# Patient Record
Sex: Female | Born: 1935
Health system: Southern US, Community
[De-identification: ages and names within clinical notes are randomized; demographics above are authoritative.]

## PROBLEM LIST (undated history)

## (undated) DIAGNOSIS — C801 Malignant (primary) neoplasm, unspecified: Secondary | ICD-10-CM

## (undated) HISTORY — PX: LYMPHADENECTOMY: SHX15

## (undated) HISTORY — PX: TONSILLECTOMY: SUR1361

## (undated) HISTORY — PX: OTHER SURGICAL HISTORY: SHX169

## (undated) HISTORY — PX: BACK SURGERY: SHX140

---

## 1997-09-25 ENCOUNTER — Other Ambulatory Visit: Admission: RE | Admit: 1997-09-25 | Discharge: 1997-09-25 | Payer: Self-pay | Admitting: Hematology and Oncology

## 1998-01-13 ENCOUNTER — Ambulatory Visit (HOSPITAL_COMMUNITY): Admission: RE | Admit: 1998-01-13 | Discharge: 1998-01-13 | Payer: Self-pay | Admitting: Hematology and Oncology

## 1998-07-03 ENCOUNTER — Other Ambulatory Visit: Admission: RE | Admit: 1998-07-03 | Discharge: 1998-07-03 | Payer: Self-pay | Admitting: *Deleted

## 1999-02-09 ENCOUNTER — Other Ambulatory Visit: Admission: RE | Admit: 1999-02-09 | Discharge: 1999-02-09 | Payer: Self-pay | Admitting: *Deleted

## 1999-08-11 ENCOUNTER — Encounter: Payer: Self-pay | Admitting: Hematology and Oncology

## 1999-08-11 ENCOUNTER — Encounter: Admission: RE | Admit: 1999-08-11 | Discharge: 1999-08-11 | Payer: Self-pay | Admitting: Hematology and Oncology

## 2000-07-10 ENCOUNTER — Other Ambulatory Visit: Admission: RE | Admit: 2000-07-10 | Discharge: 2000-07-10 | Payer: Self-pay | Admitting: *Deleted

## 2000-08-15 ENCOUNTER — Encounter: Admission: RE | Admit: 2000-08-15 | Discharge: 2000-08-15 | Payer: Self-pay | Admitting: Hematology and Oncology

## 2000-08-15 ENCOUNTER — Encounter: Payer: Self-pay | Admitting: Hematology and Oncology

## 2000-08-18 ENCOUNTER — Encounter: Payer: Self-pay | Admitting: Hematology and Oncology

## 2000-08-18 ENCOUNTER — Encounter: Admission: RE | Admit: 2000-08-18 | Discharge: 2000-08-18 | Payer: Self-pay | Admitting: Hematology and Oncology

## 2000-11-29 ENCOUNTER — Other Ambulatory Visit: Admission: RE | Admit: 2000-11-29 | Discharge: 2000-11-29 | Payer: Self-pay | Admitting: Gynecology

## 2001-07-16 ENCOUNTER — Other Ambulatory Visit: Admission: RE | Admit: 2001-07-16 | Discharge: 2001-07-16 | Payer: Self-pay | Admitting: *Deleted

## 2001-08-20 ENCOUNTER — Encounter: Payer: Self-pay | Admitting: Hematology and Oncology

## 2001-08-20 ENCOUNTER — Encounter: Admission: RE | Admit: 2001-08-20 | Discharge: 2001-08-20 | Payer: Self-pay | Admitting: Hematology and Oncology

## 2001-10-03 ENCOUNTER — Encounter: Payer: Self-pay | Admitting: Neurosurgery

## 2001-10-05 ENCOUNTER — Inpatient Hospital Stay (HOSPITAL_COMMUNITY): Admission: RE | Admit: 2001-10-05 | Discharge: 2001-10-08 | Payer: Self-pay | Admitting: Neurosurgery

## 2001-10-05 ENCOUNTER — Encounter: Payer: Self-pay | Admitting: Neurosurgery

## 2001-11-05 ENCOUNTER — Encounter: Admission: RE | Admit: 2001-11-05 | Discharge: 2001-12-14 | Payer: Self-pay | Admitting: Neurosurgery

## 2002-08-22 ENCOUNTER — Encounter: Admission: RE | Admit: 2002-08-22 | Discharge: 2002-08-22 | Payer: Self-pay | Admitting: Oncology

## 2002-08-22 ENCOUNTER — Encounter: Payer: Self-pay | Admitting: Oncology

## 2003-08-25 ENCOUNTER — Encounter: Admission: RE | Admit: 2003-08-25 | Discharge: 2003-08-25 | Payer: Self-pay | Admitting: Oncology

## 2004-08-26 ENCOUNTER — Encounter: Admission: RE | Admit: 2004-08-26 | Discharge: 2004-08-26 | Payer: Self-pay | Admitting: Oncology

## 2004-12-01 ENCOUNTER — Ambulatory Visit: Payer: Self-pay | Admitting: Oncology

## 2005-06-02 ENCOUNTER — Ambulatory Visit: Payer: Self-pay | Admitting: Oncology

## 2005-08-30 ENCOUNTER — Encounter: Admission: RE | Admit: 2005-08-30 | Discharge: 2005-08-30 | Payer: Self-pay | Admitting: Oncology

## 2005-11-25 ENCOUNTER — Ambulatory Visit: Payer: Self-pay | Admitting: Oncology

## 2006-05-19 ENCOUNTER — Ambulatory Visit: Payer: Self-pay | Admitting: Oncology

## 2006-09-04 ENCOUNTER — Encounter: Admission: RE | Admit: 2006-09-04 | Discharge: 2006-09-04 | Payer: Self-pay | Admitting: Oncology

## 2007-03-16 ENCOUNTER — Ambulatory Visit: Payer: Self-pay | Admitting: Oncology

## 2007-09-05 ENCOUNTER — Encounter: Admission: RE | Admit: 2007-09-05 | Discharge: 2007-09-05 | Payer: Self-pay | Admitting: Oncology

## 2007-09-13 ENCOUNTER — Ambulatory Visit: Payer: Self-pay | Admitting: Oncology

## 2007-12-27 ENCOUNTER — Encounter (INDEPENDENT_AMBULATORY_CARE_PROVIDER_SITE_OTHER): Payer: Self-pay | Admitting: Orthopedic Surgery

## 2007-12-27 ENCOUNTER — Inpatient Hospital Stay (HOSPITAL_COMMUNITY): Admission: EM | Admit: 2007-12-27 | Discharge: 2007-12-31 | Payer: Self-pay | Admitting: Emergency Medicine

## 2008-04-14 ENCOUNTER — Encounter: Admission: RE | Admit: 2008-04-14 | Discharge: 2008-05-12 | Payer: Self-pay | Admitting: Internal Medicine

## 2008-09-11 ENCOUNTER — Encounter: Admission: RE | Admit: 2008-09-11 | Discharge: 2008-09-11 | Payer: Self-pay | Admitting: Family Medicine

## 2009-09-16 ENCOUNTER — Encounter: Admission: RE | Admit: 2009-09-16 | Discharge: 2009-09-16 | Payer: Self-pay | Admitting: Family Medicine

## 2010-10-19 NOTE — H&P (Signed)
Sierra Allen, Sierra Allen          ACCOUNT NO.:  [id number]   MEDICAL RECORD NO.:  [id number]          PATIENT TYPE:  INP   LOCATION:  0098                         FACILITY:  [facility]   PHYSICIAN:  Eulas Post, MD    DATE OF BIRTH:  1926/11/29   DATE OF ADMISSION:  12/27/2007  DATE OF DISCHARGE:                              HISTORY & PHYSICAL   CHIEF COMPLAINT:  Left hip pain.   HISTORY OF PRESENT ILLNESS:  Patient is a 77 year old white female who  fell today at the respite at [facility].  She was playing balloon  volleyball.  She reached out to hit a balloon, fell, landed on her  twisted left leg.  She had an immediate sharp 10/10 pain in her left hip  with obvious deformity.  She was transported by EMS to [facility].   PAST MEDICAL HISTORY:  Significant for breast cancer in 1998, which she  had chemo for.  No history of diabetes or heart disease.   FAMILY HISTORY:  Mom is deceased with a stroke.  Dad is deceased with  cancer.   SOCIAL HISTORY:  Patient is widowed.  She lives alone.  No tobacco, no  alcohol, no drugs.   ALLERGIES:  PENICILLIN.   CURRENT MEDICATIONS:  None.   REVIEW OF SYSTEMS:  Positive for left hip pain.  Easy bruising.  Hard of  hearing.  Negative for recent weight loss, visual changes.  No chest  pain.  No shortness of breath, no nausea, vomiting, diarrhea, or  constipation.  No urinary frequency or pain.  No numbness, no tingling.  No psychiatric problems.  No diabetes.  No thyroid.  No cough, cold, or  recent infections.   PHYSICAL EXAMINATION:  Blood pressure is 158/83, respirations 18, pulse  94, temperature 98.  She is a well-developed and well-nourished 77 year old white female in  mild distress.  She is normocephalic and atraumatic.  Extraocular movements are intact.  Pupils are equal, round and reactive to light and accommodation.  Glasses are in place.  NECK:  Supple without adenopathy or JVD.  CHEST:  Clear.  No wheezes,  rales or rhonchi.  Both breasts have healed scars from lumpectomy with a history of a  breast cancer in her right breast.  HEART:  Normal S1 and S2 with a regular rate and rhythm without murmur,  rub or gallop.  ABDOMEN:  Soft and nontender with positive bowel sounds.  No  organomegaly.  PELVIC:  External:  She has left hip pain.  No right-sided external  pain.  Internal exam was not performed.  GU:  She had a Foley in place with good urine production.  EXTREMITIES:  Right lower extremity and both upper extremities have full  range of motion without pain, swelling, or deformity.  Left hip is  shortened, internally rotated with significant pain.  She has a 2+  dorsalis pedis pulse, multiple spider veins, and tenderness about her  left hip.  SKIN:  Warm and dry without rashes or abrasions.  She has multiple areas  of ecchymosis on both upper extremities, she states from her easy  bruising.  She also had marked spider veins on her lower extremities.  NEUROLOGIC:  She is alert and oriented x3.  Cranial nerves II-XII are  grossly intact.  She has no numbness or tingling anywhere.   X-rays show left hip subtrochanteric femur fracture.   Labs show elevated white count of 11.9 with a shift secondary to trauma.  Hemoglobin is 12.7, hematocrit 37.8.  Platelets are borderline normal at  154.  Sodium is 138, potassium 3.7, chloride 105, CO2 26, BUN 14,  creatinine 0.7, and glucose is 100.  Her INR is within normal limits at  1.  Her UA shows some elevated ketones and no other abnormalities with a  negative leukocyte esterase and no nitrites.  No bacteria seen.   IMPRESSION:  1. Left hip subtrochanteric fracture secondary to trauma on December 27, 2007.  2. History of breast cancer in 1998.   PLAN:  At this point in time she is being admitted for IM nailing of her  left hip.  Risks, benefits and possible complications of this surgery as  well as postoperative course is discussed with the  patient in detail.  She does understand that it will be medically necessary for her to go to  a skilled nursing facility following surgery, following a short  hospitalization, for complete recovery.  I have discussed this with her.  [facility] choice [facility].  A social work consult was ordered for  this.  Risks, benefits and possible complications of the procedure have  been discussed in detail with the patient, and she is without question.      Kirstin Shepperson, P.A.      Eulas Post, MD  Electronically Signed    KS/MEDQ  D:  12/27/2007  T:  12/27/2007  Job:  [id number]

## 2010-10-19 NOTE — Op Note (Signed)
Allen, Sierra          ACCOUNT NO.:  [id number]   MEDICAL RECORD NO.:  [id number]          PATIENT TYPE:  INP   LOCATION:  0098                         FACILITY:  [facility]   PHYSICIAN:  Eulas Post, MD    DATE OF BIRTH:  27-Jan-1927   DATE OF PROCEDURE:  12/27/2007  DATE OF DISCHARGE:                               OPERATIVE REPORT   PREOPERATIVE DIAGNOSIS:  Left reverse obliquity subtrochanteric femoral  fracture.   POSTOPERATIVE DIAGNOSIS:  Left reverse obliquity subtrochanteric femoral  fracture.   OPERATIVE PROCEDURE:  Closed reduction with long trochanteric femoral  nailing of the left reverse obliquity subtrochanteric fracture.   ANESTHESIA:  General.   ESTIMATED BLOOD LOSS:  100 mL.   OPERATIVE IMPLANT:  Synthes size 11 left long trochanteric femoral nail  with a length of 360 mm and a single helical blade size 90 mm.   PREOPERATIVE INDICATIONS:  Ms. Sierra Allen is a 77 year old woman  who was reportedly playing volleyball when she fell and broke her left  hip.  She was brought to the emergency room and admitted and optimized  preoperatively and underwent closed reduction with intramedullary  nailing.  Risks, benefits and alternatives were discussed with her  preoperatively including but not limited to the risks of infection,  bleeding, nerve injury, malunion, nonunion, leg-length discrepancy,  rotational deformity, hardware failure, hardware prominence, need for  hardware removal, the need for revision surgery, cardiopulmonary  complications, among others, and she is willing to proceed.   SPECIMENS:  Reamings from the proximal femur were sent to pathology  given her history of breast cancer.   OPERATIVE PROCEDURE:  The patient was brought to the operating and  placed in supine position.  General anesthesia was administered.  Left  lower extremity was prepped and draped in the usual sterile fashion.  Incision was made over the proximal trochanter.   Guidewire was placed.  Satisfactory position was achieved in the proximal segment.  Reduction  was performed.  We opened the proximal segment and then placed the long  guidewire down the femur.  Appropriate length was selected.  We then  [facility] our nail into place.  We actually placed the nail by hand and  did not require a mallet.  We then placed our helical blade using the  appropriate jig.  Center/center position was achieved, and excellent tip  apex defects distance was achieved.  We then tested leg by reducing the  traction and taking the leg under live fluoroscopy with rotation as well  as direct impaction, and it was found have an excellent stability.  The  rotational stability and length was appropriate.  Therefore, we decided  not to interlock the nail distally, as this would also increase the risk  of periprosthetic fracture through a stress riser distally.  The wounds  were then irrigated copiously and deep tissue closed with 0 Vicryl  followed by 3-0 followed by Monocryl for the skin.  Steri-Strips were  applied followed by gauze.  The patient was awakened and returned to  PACU in stable and satisfactory condition.  There were no complications.  The patient tolerated the  procedure well.      Eulas Post, MD  Electronically Signed     JPL/MEDQ  D:  12/27/2007  T:  12/27/2007  Job:  [id number]

## 2010-10-19 NOTE — Discharge Summary (Signed)
Sierra Allen, Sierra Allen          ACCOUNT NO.:  [id number]   MEDICAL RECORD NO.:  [id number]          PATIENT TYPE:  INP   LOCATION:  1604                         FACILITY:  [facility]   PHYSICIAN:  Eulas Post, MD    DATE OF BIRTH:  07-17-1926   DATE OF ADMISSION:  12/27/2007  DATE OF DISCHARGE:  12/31/2007                               DISCHARGE SUMMARY   TRANSFER SUMMARY:   ADMISSION DIAGNOSIS:  Left intertrochanteric hip fracture  (subtrochanteric reverse obliquity).   DISCHARGE DIAGNOSIS:  Left intertrochanteric hip fracture  (subtrochanteric reverse obliquity).   ADDITIONAL DIAGNOSIS:  Acute postoperative blood loss anemia.   PROCEDURE:  Left femur trochanteric femoral nailing performed December 27, 2007.   DISCHARGE MEDICATIONS:  1. Coumadin 2.5 mg p.o. daily.  2. Vicodin 1-2 tablets p.o. q.12h. p.r.n. pain.  3. Colace 100 mg p.o. b.i.d.   HOSPITAL COURSE:  Mrs. Sierra Allen is a 77 year old woman who  fell while playing volleyball and broke her left hip.  She was found to  have a subtrochanteric femur fracture.  She underwent intramedullary  nailing and tolerated the procedure well.  There were no complications.  She began working with physical therapy on postoperative day #1 and was  making slow progress.  Her wounds were clean, dry, and intact.  She has  been on Coumadin for DVT prophylaxis.  She was given sequential  compression devices also.  She was given Ancef for antimicrobial  prophylaxis.  Her hemoglobin and hematocrit dropped to a low of 8 and  26; however, she remained relatively asymptomatic.  Her wounds were dry  and healing well.   DISPOSITION:  She can be weightbearing as tolerated and is going to be  transferred to a skilled nursing facility for ongoing therapy.  She will  be on Coumadin for a period of about 1 month until the date of her  surgery.  The patient benefited maximally from her hospital stay, and  there were no complications.  She is  planned to follow up with me in  approximately 2 weeks.      Eulas Post, MD  Electronically Signed     JPL/MEDQ  D:  12/31/2007  T:  12/31/2007  Job:  [id number]

## 2010-10-22 NOTE — Discharge Summary (Signed)
[place]. [facility]  Patient:    GREGORY, DOWE Visit Number: [id number] MRN: [id number]          Service Type: SUR Location: 3000 3012 [id number] Attending Physician:  Danella Penton Dictated by:   Tanya Nones. Jeral Fruit, M.D. Admit Date:  10/05/2001 Discharge Date: 10/08/2001                             Discharge Summary  ADMISSION DIAGNOSIS:  Lumbar stenosis with a neurogenic claudication.  DISCHARGE DIAGNOSIS:  Lumbar stenosis with a neurogenic claudication.  HISTORY OF PRESENT ILLNESS:  The patient was admitted because of neurogenic claudication.  X-ray showed diffuse lumbar stenosis from L2-3 down to L5-S1. Surgery was advised.  LABORATORY DATA:  Normal.  HOSPITAL COURSE:  The patient was taken to surgery and a bilateral 3-4-5 laminectomy and partial 3 was done.  We found a loose facet and because of that we used her own bone and proceeded with a posterolateral fusion.  Today, the patient is doing great.  She is ambulating.  She feels better.  Wounds look fine, she is afebrile, and she is ready to go home.  CONDITION ON DISCHARGE:  Improved.  DISCHARGE MEDICATIONS:  Percocet and Flexoril.  DIET:  Regular.  ACTIVITY:  She is not to drive for at least two weeks and she is to do no lifting more than 5 pounds.  FOLLOW-UP:  I will see her in four weeks in my office.  Also, we are going to arrange for her to have home health physical therapy at home.  Any questions, she is going to call me anytime. Dictated by:   Tanya Nones. Jeral Fruit, M.D. Attending Physician:  Danella Penton DD:  10/08/01 TD:  10/10/01 Job: [id number]

## 2010-10-22 NOTE — H&P (Signed)
[place]. [facility]  Patient:    Sierra Allen, Sierra Allen Visit Number: [id number] MRN: [id number]          Service Type: SUR Location: 3000 3012 [id number] Attending Physician:  Danella Penton Dictated by:   Tanya Nones. Jeral Fruit, M.D. Admit Date:  10/05/2001                           History and Physical  HISTORY OF PRESENT ILLNESS:  Sierra Allen is a lady who had been complaining for many years of back pain with radiation down to both legs and weakness. Right now, she is complaining of numbness in both legs, weakness of both feet, right worse than the left one, up to the point that she cannot lift the right foot and claudication when she walks.  She tells me that when she walks a block or less, she has to sit because she develops cramping and numbness in both legs.  She had a peripheral vascular workup which was negative.  Because of the x-rays, she is being admitted today for surgery.  PAST MEDICAL HISTORY:  Cancer of the breast in 1998.  ALLERGIES:  She is allergic to PENICILLIN.  Also, she tells me that she is allergic to [facility].  SOCIAL HISTORY:  Negative.  FAMILY HISTORY:  Mother died when she was 72 of a stroke.  REVIEW OF SYSTEMS:  Positive for hearing loss on the left side and also in the right side.  She has weakness of both legs and back pain.  Also, she tells me that she is allergic to shellfish.  PHYSICAL EXAMINATION:  GENERAL:  The patient, when she came to see me, had difficulty walking on the right leg.  HEENT:  Normal.  NECK:  Normal.  LUNGS:  Clear.  HEART:  Heart sounds normal.  ABDOMEN:  Normal.  EXTREMITIES:  Normal pulses.  NEUROLOGIC:  Mental status normal.  Cranial nerves are normal except for hearing loss bilaterally.  Motor examination showed that she has weakness on dorsiflexion of both feet, the right worse than the left one -- it was 2/5 -- and plantar flexion of the feet was 3/5.  There is no atrophy.   Sensation normal.  Deep tendon reflexes 1+.  LABORATORY AND ACCESSORY DATA:  The MRI showed that she has a severe case of spondylosis at the levels of 3-4, 4-5 and borderline between 2 and 3.  CLINICAL IMPRESSION:  Neurogenic claudication secondary to lumbar stenosis.  RECOMMENDATIONS:  The patient is being admitted for bilateral 3, 4, 5 and partial 2 laminectomies with foraminotomies.  The risks of course are infection, CSF leak, worsening of the pain, paralysis, need for further surgery, damage to the vessels of the abdomen and no improvement whatsoever. Dictated by:   Tanya Nones. Jeral Fruit, M.D. Attending Physician:  Danella Penton DD:  10/05/01 TD:  10/06/01 Job: [id number]

## 2010-10-22 NOTE — Op Note (Signed)
[place]. [facility]  Patient:    Sierra Allen, Sierra Allen Visit Number: [id number] MRN: [id number]          Service Type: SUR Location: 3000 3012 [id number] Attending Physician:  Danella Penton Dictated by:   Tanya Nones. Jeral Fruit, M.D. Proc. Date: 10/05/01 Admit Date:  10/05/2001                             Operative Report  PREOPERATIVE DIAGNOSIS:  Lumbar stenosis with neurogenic claudication.  POSTOPERATIVE DIAGNOSIS:  Lumbar stenosis with neurogenic claudication.  PROCEDURES:  L3, L4, L5 laminectomy, foraminotomy, partial laminotomy of L2, posterolateral fusion L3 to S1 bilaterally.  SURGEON:  Tanya Nones. Jeral Fruit, M.D.  ASSISTANT:  Payton Doughty, M.D.  CLINICAL HISTORY:  The patient was admitted because of claudication.  The patient has also weakness of both feet.  She cannot walk more than one block, develops cramping and numbness in both legs, and she has to stop.  X-rays show severe stenosis between 3-4, 4-5, 5-1, _____ at the level of 2-3.  Surgery was advised.  DESCRIPTION OF PROCEDURE:  The patient was taken to the operating room, and she was positioned in a prone manner.  The back was prepped with Betadine.  A midline incision from L2 to S1 was made.  Muscle was retracted laterally. With the [place], we removed the spinous process of 5, 4, 3 and partial of L2.  With the drill we started drilling the laminae starting at the level of 5, going all the way up to 3.  Indeed, there was quite a bit of stenosis up to the point that the dural sac was probably about half an inch from the facet joint down.  Having good decompression, we went laterally and we did a foraminotomy to decompress the S1, L4, L4, and L3 nerve roots.  We undermined the lamina of L2, we removed the yellow ligament, and we were able to decompress L2 bilaterally.  We found that the left 4 facet was loose.  Because of that we decided to remove it.  We went laterally, and we identified  the spinous process of 3, 4, 5, and the ala of the sacrum.  Using autograft plus Vitoss, we mixed all that with blood and we went laterally.  We drilled the spinous process, and we packed the gutter with this mixture of bone and Vitoss.  X-ray showed that indeed we were at the level of L2 through S1. Having good hemostasis, the area was irrigated.  The wound was closed with Vicryl and a Steri-Strip.  The patient did well, and she is going to go t the recovery room. Dictated by:   Tanya Nones. Jeral Fruit, M.D. Attending Physician:  Danella Penton DD:  10/05/01 TD:  10/08/01 Job: [id number]

## 2011-03-04 LAB — BASIC METABOLIC PANEL
BUN: 13
BUN: 6
CO2: 22
CO2: 24
Chloride: 110
Chloride: 112
GFR calc Af Amer: >60
GFR calc Af Amer: >60
GFR calc non Af Amer: >60
GFR calc non Af Amer: >60
Glucose, Bld: 105 — ABNORMAL HIGH
Glucose, Bld: 116 — ABNORMAL HIGH
Glucose, Bld: 127 — ABNORMAL HIGH
Potassium: 3.6
Potassium: 3.9
Potassium: 4.2
Potassium: 4.5
Sodium: 138
Sodium: 138
Sodium: 140
Sodium: 140

## 2011-03-04 LAB — URINALYSIS, ROUTINE W REFLEX MICROSCOPIC
Glucose, UA: NEGATIVE
Hgb urine dipstick: NEGATIVE
Protein, ur: NEGATIVE
Specific Gravity, Urine: 1.013
pH: 7.5

## 2011-03-04 LAB — CBC
HCT: 26 — ABNORMAL LOW
HCT: 31.5 — ABNORMAL LOW
HCT: 37.8
Hemoglobin: 10.6 — ABNORMAL LOW
Hemoglobin: 12.7
Hemoglobin: 8.8 — ABNORMAL LOW
Hemoglobin: 9.3 — ABNORMAL LOW
MCHC: 34.1
MCV: 96
MCV: 96
MCV: 96.1
MCV: 96.9
Platelets: 103 — ABNORMAL LOW
Platelets: 105 — ABNORMAL LOW
Platelets: 129 — ABNORMAL LOW
RBC: 2.84 — ABNORMAL LOW
RBC: 3.22 — ABNORMAL LOW
RBC: 3.25 — ABNORMAL LOW
RBC: 3.93
RDW: 13.8
WBC: 11.9 — ABNORMAL HIGH
WBC: 12.2 — ABNORMAL HIGH
WBC: 6.2
WBC: 8.5

## 2011-03-04 LAB — PROTIME-INR
INR: 1
INR: 1.7 — ABNORMAL HIGH
INR: 1.8 — ABNORMAL HIGH
Prothrombin Time: 13.8
Prothrombin Time: 20.9 — ABNORMAL HIGH
Prothrombin Time: 22 — ABNORMAL HIGH

## 2011-03-04 LAB — CROSSMATCH
ABO/RH(D): O POS
Antibody Screen: NEGATIVE

## 2011-03-04 LAB — POCT I-STAT, CHEM 8
Calcium, Ion: 1.13
Chloride: 105
Glucose, Bld: 100 — ABNORMAL HIGH
HCT: 38
Hemoglobin: 12.9
TCO2: 26

## 2011-03-04 LAB — DIFFERENTIAL
Basophils Absolute: 0
Basophils Relative: 0
Eosinophils Absolute: 0
Eosinophils Relative: 0
Monocytes Absolute: 0.6
Monocytes Relative: 5
Neutro Abs: 10.7 — ABNORMAL HIGH

## 2014-09-08 DIAGNOSIS — H35371 Puckering of macula, right eye: Secondary | ICD-10-CM | POA: Diagnosis not present

## 2014-09-08 DIAGNOSIS — H40012 Open angle with borderline findings, low risk, left eye: Secondary | ICD-10-CM | POA: Diagnosis not present

## 2014-09-08 DIAGNOSIS — H26491 Other secondary cataract, right eye: Secondary | ICD-10-CM | POA: Diagnosis not present

## 2014-09-08 DIAGNOSIS — H3531 Nonexudative age-related macular degeneration: Secondary | ICD-10-CM | POA: Diagnosis not present

## 2014-09-30 DIAGNOSIS — H3532 Exudative age-related macular degeneration: Secondary | ICD-10-CM | POA: Diagnosis not present

## 2014-09-30 DIAGNOSIS — H3531 Nonexudative age-related macular degeneration: Secondary | ICD-10-CM | POA: Diagnosis not present

## 2014-09-30 DIAGNOSIS — H35051 Retinal neovascularization, unspecified, right eye: Secondary | ICD-10-CM | POA: Diagnosis not present

## 2014-10-02 DIAGNOSIS — H3532 Exudative age-related macular degeneration: Secondary | ICD-10-CM | POA: Diagnosis not present

## 2014-11-06 DIAGNOSIS — H3532 Exudative age-related macular degeneration: Secondary | ICD-10-CM | POA: Diagnosis not present

## 2014-12-15 DIAGNOSIS — H3532 Exudative age-related macular degeneration: Secondary | ICD-10-CM | POA: Diagnosis not present

## 2014-12-18 DIAGNOSIS — H40012 Open angle with borderline findings, low risk, left eye: Secondary | ICD-10-CM | POA: Diagnosis not present

## 2015-02-02 DIAGNOSIS — H3532 Exudative age-related macular degeneration: Secondary | ICD-10-CM | POA: Diagnosis not present

## 2015-03-04 DIAGNOSIS — Z23 Encounter for immunization: Secondary | ICD-10-CM | POA: Diagnosis not present

## 2015-03-30 DIAGNOSIS — H353211 Exudative age-related macular degeneration, right eye, with active choroidal neovascularization: Secondary | ICD-10-CM | POA: Diagnosis not present

## 2015-04-13 DIAGNOSIS — H35371 Puckering of macula, right eye: Secondary | ICD-10-CM | POA: Diagnosis not present

## 2015-04-13 DIAGNOSIS — Z961 Presence of intraocular lens: Secondary | ICD-10-CM | POA: Diagnosis not present

## 2015-04-13 DIAGNOSIS — H40012 Open angle with borderline findings, low risk, left eye: Secondary | ICD-10-CM | POA: Diagnosis not present

## 2015-04-13 DIAGNOSIS — H43813 Vitreous degeneration, bilateral: Secondary | ICD-10-CM | POA: Diagnosis not present

## 2015-06-10 DIAGNOSIS — H35363 Drusen (degenerative) of macula, bilateral: Secondary | ICD-10-CM | POA: Diagnosis not present

## 2015-06-10 DIAGNOSIS — H353211 Exudative age-related macular degeneration, right eye, with active choroidal neovascularization: Secondary | ICD-10-CM | POA: Diagnosis not present

## 2015-06-10 DIAGNOSIS — H26492 Other secondary cataract, left eye: Secondary | ICD-10-CM | POA: Diagnosis not present

## 2015-09-08 DIAGNOSIS — H353211 Exudative age-related macular degeneration, right eye, with active choroidal neovascularization: Secondary | ICD-10-CM | POA: Diagnosis not present

## 2015-09-08 DIAGNOSIS — H353133 Nonexudative age-related macular degeneration, bilateral, advanced atrophic without subfoveal involvement: Secondary | ICD-10-CM | POA: Diagnosis not present

## 2015-09-24 DIAGNOSIS — Z08 Encounter for follow-up examination after completed treatment for malignant neoplasm: Secondary | ICD-10-CM | POA: Diagnosis not present

## 2015-09-24 DIAGNOSIS — X32XXXA Exposure to sunlight, initial encounter: Secondary | ICD-10-CM | POA: Diagnosis not present

## 2015-09-24 DIAGNOSIS — L570 Actinic keratosis: Secondary | ICD-10-CM | POA: Diagnosis not present

## 2015-09-24 DIAGNOSIS — C44622 Squamous cell carcinoma of skin of right upper limb, including shoulder: Secondary | ICD-10-CM | POA: Diagnosis not present

## 2015-09-24 DIAGNOSIS — Z85828 Personal history of other malignant neoplasm of skin: Secondary | ICD-10-CM | POA: Diagnosis not present

## 2015-10-20 DIAGNOSIS — H40012 Open angle with borderline findings, low risk, left eye: Secondary | ICD-10-CM | POA: Diagnosis not present

## 2015-10-20 DIAGNOSIS — H2589 Other age-related cataract: Secondary | ICD-10-CM | POA: Diagnosis not present

## 2015-11-11 DIAGNOSIS — Z85828 Personal history of other malignant neoplasm of skin: Secondary | ICD-10-CM | POA: Diagnosis not present

## 2015-11-11 DIAGNOSIS — L723 Sebaceous cyst: Secondary | ICD-10-CM | POA: Diagnosis not present

## 2015-11-11 DIAGNOSIS — Z08 Encounter for follow-up examination after completed treatment for malignant neoplasm: Secondary | ICD-10-CM | POA: Diagnosis not present

## 2015-11-18 DIAGNOSIS — L905 Scar conditions and fibrosis of skin: Secondary | ICD-10-CM | POA: Diagnosis not present

## 2015-11-18 DIAGNOSIS — C44622 Squamous cell carcinoma of skin of right upper limb, including shoulder: Secondary | ICD-10-CM | POA: Diagnosis not present

## 2015-11-18 DIAGNOSIS — Z08 Encounter for follow-up examination after completed treatment for malignant neoplasm: Secondary | ICD-10-CM | POA: Diagnosis not present

## 2015-11-18 DIAGNOSIS — Z85828 Personal history of other malignant neoplasm of skin: Secondary | ICD-10-CM | POA: Diagnosis not present

## 2016-01-05 DIAGNOSIS — H26492 Other secondary cataract, left eye: Secondary | ICD-10-CM | POA: Diagnosis not present

## 2016-01-05 DIAGNOSIS — H35363 Drusen (degenerative) of macula, bilateral: Secondary | ICD-10-CM | POA: Diagnosis not present

## 2016-01-05 DIAGNOSIS — H353133 Nonexudative age-related macular degeneration, bilateral, advanced atrophic without subfoveal involvement: Secondary | ICD-10-CM | POA: Diagnosis not present

## 2016-01-05 DIAGNOSIS — H353212 Exudative age-related macular degeneration, right eye, with inactive choroidal neovascularization: Secondary | ICD-10-CM | POA: Diagnosis not present

## 2016-02-16 DIAGNOSIS — H26492 Other secondary cataract, left eye: Secondary | ICD-10-CM | POA: Diagnosis not present

## 2016-02-16 DIAGNOSIS — H353133 Nonexudative age-related macular degeneration, bilateral, advanced atrophic without subfoveal involvement: Secondary | ICD-10-CM | POA: Diagnosis not present

## 2016-02-16 DIAGNOSIS — H35363 Drusen (degenerative) of macula, bilateral: Secondary | ICD-10-CM | POA: Diagnosis not present

## 2016-02-16 DIAGNOSIS — H353212 Exudative age-related macular degeneration, right eye, with inactive choroidal neovascularization: Secondary | ICD-10-CM | POA: Diagnosis not present

## 2016-02-21 DIAGNOSIS — Z23 Encounter for immunization: Secondary | ICD-10-CM | POA: Diagnosis not present

## 2016-03-30 DIAGNOSIS — X32XXXD Exposure to sunlight, subsequent encounter: Secondary | ICD-10-CM | POA: Diagnosis not present

## 2016-03-30 DIAGNOSIS — Z85828 Personal history of other malignant neoplasm of skin: Secondary | ICD-10-CM | POA: Diagnosis not present

## 2016-03-30 DIAGNOSIS — I872 Venous insufficiency (chronic) (peripheral): Secondary | ICD-10-CM | POA: Diagnosis not present

## 2016-03-30 DIAGNOSIS — D225 Melanocytic nevi of trunk: Secondary | ICD-10-CM | POA: Diagnosis not present

## 2016-03-30 DIAGNOSIS — L570 Actinic keratosis: Secondary | ICD-10-CM | POA: Diagnosis not present

## 2016-03-30 DIAGNOSIS — Z08 Encounter for follow-up examination after completed treatment for malignant neoplasm: Secondary | ICD-10-CM | POA: Diagnosis not present

## 2016-04-14 DIAGNOSIS — H353211 Exudative age-related macular degeneration, right eye, with active choroidal neovascularization: Secondary | ICD-10-CM | POA: Diagnosis not present

## 2016-04-14 DIAGNOSIS — Z961 Presence of intraocular lens: Secondary | ICD-10-CM | POA: Diagnosis not present

## 2016-04-14 DIAGNOSIS — H353120 Nonexudative age-related macular degeneration, left eye, stage unspecified: Secondary | ICD-10-CM | POA: Diagnosis not present

## 2016-04-14 DIAGNOSIS — H40013 Open angle with borderline findings, low risk, bilateral: Secondary | ICD-10-CM | POA: Diagnosis not present

## 2016-07-18 DIAGNOSIS — H353212 Exudative age-related macular degeneration, right eye, with inactive choroidal neovascularization: Secondary | ICD-10-CM | POA: Diagnosis not present

## 2016-07-18 DIAGNOSIS — H353133 Nonexudative age-related macular degeneration, bilateral, advanced atrophic without subfoveal involvement: Secondary | ICD-10-CM | POA: Diagnosis not present

## 2016-08-03 DIAGNOSIS — T798XXA Other early complications of trauma, initial encounter: Secondary | ICD-10-CM | POA: Diagnosis not present

## 2016-08-03 DIAGNOSIS — S81801A Unspecified open wound, right lower leg, initial encounter: Secondary | ICD-10-CM | POA: Diagnosis not present

## 2016-08-10 DIAGNOSIS — R351 Nocturia: Secondary | ICD-10-CM | POA: Diagnosis not present

## 2016-08-10 DIAGNOSIS — S81811D Laceration without foreign body, right lower leg, subsequent encounter: Secondary | ICD-10-CM | POA: Diagnosis not present

## 2016-08-10 DIAGNOSIS — M810 Age-related osteoporosis without current pathological fracture: Secondary | ICD-10-CM | POA: Diagnosis not present

## 2016-08-10 DIAGNOSIS — E782 Mixed hyperlipidemia: Secondary | ICD-10-CM | POA: Diagnosis not present

## 2016-08-12 DIAGNOSIS — R351 Nocturia: Secondary | ICD-10-CM | POA: Diagnosis not present

## 2016-08-12 DIAGNOSIS — S81811D Laceration without foreign body, right lower leg, subsequent encounter: Secondary | ICD-10-CM | POA: Diagnosis not present

## 2016-08-12 DIAGNOSIS — M810 Age-related osteoporosis without current pathological fracture: Secondary | ICD-10-CM | POA: Diagnosis not present

## 2016-08-12 DIAGNOSIS — E782 Mixed hyperlipidemia: Secondary | ICD-10-CM | POA: Diagnosis not present

## 2016-08-17 DIAGNOSIS — S81811D Laceration without foreign body, right lower leg, subsequent encounter: Secondary | ICD-10-CM | POA: Diagnosis not present

## 2016-08-17 DIAGNOSIS — T798XXA Other early complications of trauma, initial encounter: Secondary | ICD-10-CM | POA: Diagnosis not present

## 2016-10-07 DIAGNOSIS — L02413 Cutaneous abscess of right upper limb: Secondary | ICD-10-CM | POA: Diagnosis not present

## 2016-10-07 DIAGNOSIS — S81801A Unspecified open wound, right lower leg, initial encounter: Secondary | ICD-10-CM | POA: Diagnosis not present

## 2016-10-13 DIAGNOSIS — L989 Disorder of the skin and subcutaneous tissue, unspecified: Secondary | ICD-10-CM | POA: Diagnosis not present

## 2016-10-13 DIAGNOSIS — L97319 Non-pressure chronic ulcer of right ankle with unspecified severity: Secondary | ICD-10-CM | POA: Diagnosis not present

## 2016-10-18 ENCOUNTER — Encounter (HOSPITAL_BASED_OUTPATIENT_CLINIC_OR_DEPARTMENT_OTHER): Payer: Medicare Other | Attending: Surgery

## 2016-10-18 DIAGNOSIS — L98499 Non-pressure chronic ulcer of skin of other sites with unspecified severity: Secondary | ICD-10-CM | POA: Diagnosis not present

## 2016-10-18 DIAGNOSIS — Z853 Personal history of malignant neoplasm of breast: Secondary | ICD-10-CM | POA: Diagnosis not present

## 2016-10-18 DIAGNOSIS — Z79899 Other long term (current) drug therapy: Secondary | ICD-10-CM | POA: Diagnosis not present

## 2016-10-18 DIAGNOSIS — I890 Lymphedema, not elsewhere classified: Secondary | ICD-10-CM | POA: Diagnosis not present

## 2016-10-18 DIAGNOSIS — Z923 Personal history of irradiation: Secondary | ICD-10-CM | POA: Insufficient documentation

## 2016-10-18 DIAGNOSIS — Z9221 Personal history of antineoplastic chemotherapy: Secondary | ICD-10-CM | POA: Diagnosis not present

## 2016-10-18 DIAGNOSIS — Z85828 Personal history of other malignant neoplasm of skin: Secondary | ICD-10-CM | POA: Insufficient documentation

## 2016-10-18 DIAGNOSIS — L97819 Non-pressure chronic ulcer of other part of right lower leg with unspecified severity: Secondary | ICD-10-CM | POA: Insufficient documentation

## 2016-10-18 DIAGNOSIS — L97212 Non-pressure chronic ulcer of right calf with fat layer exposed: Secondary | ICD-10-CM | POA: Diagnosis not present

## 2016-10-25 DIAGNOSIS — T8189XA Other complications of procedures, not elsewhere classified, initial encounter: Secondary | ICD-10-CM | POA: Diagnosis not present

## 2016-10-25 DIAGNOSIS — Z9221 Personal history of antineoplastic chemotherapy: Secondary | ICD-10-CM | POA: Diagnosis not present

## 2016-10-25 DIAGNOSIS — L98499 Non-pressure chronic ulcer of skin of other sites with unspecified severity: Secondary | ICD-10-CM | POA: Diagnosis not present

## 2016-10-25 DIAGNOSIS — I890 Lymphedema, not elsewhere classified: Secondary | ICD-10-CM | POA: Diagnosis not present

## 2016-10-25 DIAGNOSIS — L97819 Non-pressure chronic ulcer of other part of right lower leg with unspecified severity: Secondary | ICD-10-CM | POA: Diagnosis not present

## 2016-10-25 DIAGNOSIS — Z85828 Personal history of other malignant neoplasm of skin: Secondary | ICD-10-CM | POA: Diagnosis not present

## 2016-10-25 DIAGNOSIS — Z923 Personal history of irradiation: Secondary | ICD-10-CM | POA: Diagnosis not present

## 2016-10-26 DIAGNOSIS — L729 Follicular cyst of the skin and subcutaneous tissue, unspecified: Secondary | ICD-10-CM | POA: Diagnosis not present

## 2016-10-26 DIAGNOSIS — C44622 Squamous cell carcinoma of skin of right upper limb, including shoulder: Secondary | ICD-10-CM | POA: Diagnosis not present

## 2016-11-01 DIAGNOSIS — L97819 Non-pressure chronic ulcer of other part of right lower leg with unspecified severity: Secondary | ICD-10-CM | POA: Diagnosis not present

## 2016-11-01 DIAGNOSIS — L98499 Non-pressure chronic ulcer of skin of other sites with unspecified severity: Secondary | ICD-10-CM | POA: Diagnosis not present

## 2016-11-01 DIAGNOSIS — T8189XA Other complications of procedures, not elsewhere classified, initial encounter: Secondary | ICD-10-CM | POA: Diagnosis not present

## 2016-11-01 DIAGNOSIS — Z9221 Personal history of antineoplastic chemotherapy: Secondary | ICD-10-CM | POA: Diagnosis not present

## 2016-11-01 DIAGNOSIS — I890 Lymphedema, not elsewhere classified: Secondary | ICD-10-CM | POA: Diagnosis not present

## 2016-11-01 DIAGNOSIS — Z85828 Personal history of other malignant neoplasm of skin: Secondary | ICD-10-CM | POA: Diagnosis not present

## 2016-11-01 DIAGNOSIS — Z923 Personal history of irradiation: Secondary | ICD-10-CM | POA: Diagnosis not present

## 2016-11-08 ENCOUNTER — Encounter (HOSPITAL_BASED_OUTPATIENT_CLINIC_OR_DEPARTMENT_OTHER): Payer: Medicare Other | Attending: Surgery

## 2016-11-08 DIAGNOSIS — L97212 Non-pressure chronic ulcer of right calf with fat layer exposed: Secondary | ICD-10-CM | POA: Diagnosis not present

## 2016-11-08 DIAGNOSIS — Z9221 Personal history of antineoplastic chemotherapy: Secondary | ICD-10-CM | POA: Insufficient documentation

## 2016-11-08 DIAGNOSIS — I890 Lymphedema, not elsewhere classified: Secondary | ICD-10-CM | POA: Diagnosis not present

## 2016-11-08 DIAGNOSIS — L97819 Non-pressure chronic ulcer of other part of right lower leg with unspecified severity: Secondary | ICD-10-CM | POA: Diagnosis not present

## 2016-11-08 DIAGNOSIS — Z85828 Personal history of other malignant neoplasm of skin: Secondary | ICD-10-CM | POA: Diagnosis not present

## 2016-11-08 DIAGNOSIS — M810 Age-related osteoporosis without current pathological fracture: Secondary | ICD-10-CM | POA: Insufficient documentation

## 2016-11-08 DIAGNOSIS — Z923 Personal history of irradiation: Secondary | ICD-10-CM | POA: Diagnosis not present

## 2016-11-08 DIAGNOSIS — Z853 Personal history of malignant neoplasm of breast: Secondary | ICD-10-CM | POA: Insufficient documentation

## 2016-11-08 DIAGNOSIS — C44622 Squamous cell carcinoma of skin of right upper limb, including shoulder: Secondary | ICD-10-CM | POA: Diagnosis not present

## 2016-11-15 DIAGNOSIS — T8189XA Other complications of procedures, not elsewhere classified, initial encounter: Secondary | ICD-10-CM | POA: Diagnosis not present

## 2016-11-15 DIAGNOSIS — M810 Age-related osteoporosis without current pathological fracture: Secondary | ICD-10-CM | POA: Diagnosis not present

## 2016-11-15 DIAGNOSIS — L97819 Non-pressure chronic ulcer of other part of right lower leg with unspecified severity: Secondary | ICD-10-CM | POA: Diagnosis not present

## 2016-11-15 DIAGNOSIS — Z923 Personal history of irradiation: Secondary | ICD-10-CM | POA: Diagnosis not present

## 2016-11-15 DIAGNOSIS — Z9221 Personal history of antineoplastic chemotherapy: Secondary | ICD-10-CM | POA: Diagnosis not present

## 2016-11-15 DIAGNOSIS — I890 Lymphedema, not elsewhere classified: Secondary | ICD-10-CM | POA: Diagnosis not present

## 2016-11-15 DIAGNOSIS — Z85828 Personal history of other malignant neoplasm of skin: Secondary | ICD-10-CM | POA: Diagnosis not present

## 2016-11-22 DIAGNOSIS — Z9221 Personal history of antineoplastic chemotherapy: Secondary | ICD-10-CM | POA: Diagnosis not present

## 2016-11-22 DIAGNOSIS — Z85828 Personal history of other malignant neoplasm of skin: Secondary | ICD-10-CM | POA: Diagnosis not present

## 2016-11-22 DIAGNOSIS — M810 Age-related osteoporosis without current pathological fracture: Secondary | ICD-10-CM | POA: Diagnosis not present

## 2016-11-22 DIAGNOSIS — L97819 Non-pressure chronic ulcer of other part of right lower leg with unspecified severity: Secondary | ICD-10-CM | POA: Diagnosis not present

## 2016-11-22 DIAGNOSIS — I890 Lymphedema, not elsewhere classified: Secondary | ICD-10-CM | POA: Diagnosis not present

## 2016-11-22 DIAGNOSIS — T8189XA Other complications of procedures, not elsewhere classified, initial encounter: Secondary | ICD-10-CM | POA: Diagnosis not present

## 2016-11-22 DIAGNOSIS — Z923 Personal history of irradiation: Secondary | ICD-10-CM | POA: Diagnosis not present

## 2016-11-29 DIAGNOSIS — M810 Age-related osteoporosis without current pathological fracture: Secondary | ICD-10-CM | POA: Diagnosis not present

## 2016-11-29 DIAGNOSIS — Z9221 Personal history of antineoplastic chemotherapy: Secondary | ICD-10-CM | POA: Diagnosis not present

## 2016-11-29 DIAGNOSIS — T8189XA Other complications of procedures, not elsewhere classified, initial encounter: Secondary | ICD-10-CM | POA: Diagnosis not present

## 2016-11-29 DIAGNOSIS — L97819 Non-pressure chronic ulcer of other part of right lower leg with unspecified severity: Secondary | ICD-10-CM | POA: Diagnosis not present

## 2016-11-29 DIAGNOSIS — I890 Lymphedema, not elsewhere classified: Secondary | ICD-10-CM | POA: Diagnosis not present

## 2016-11-29 DIAGNOSIS — Z923 Personal history of irradiation: Secondary | ICD-10-CM | POA: Diagnosis not present

## 2016-11-29 DIAGNOSIS — Z85828 Personal history of other malignant neoplasm of skin: Secondary | ICD-10-CM | POA: Diagnosis not present

## 2016-12-06 ENCOUNTER — Encounter (HOSPITAL_BASED_OUTPATIENT_CLINIC_OR_DEPARTMENT_OTHER): Payer: Medicare Other | Attending: Surgery

## 2016-12-06 DIAGNOSIS — L97212 Non-pressure chronic ulcer of right calf with fat layer exposed: Secondary | ICD-10-CM | POA: Insufficient documentation

## 2016-12-06 DIAGNOSIS — M810 Age-related osteoporosis without current pathological fracture: Secondary | ICD-10-CM | POA: Insufficient documentation

## 2016-12-06 DIAGNOSIS — I890 Lymphedema, not elsewhere classified: Secondary | ICD-10-CM | POA: Diagnosis not present

## 2016-12-06 DIAGNOSIS — Z9221 Personal history of antineoplastic chemotherapy: Secondary | ICD-10-CM | POA: Insufficient documentation

## 2016-12-06 DIAGNOSIS — Z85828 Personal history of other malignant neoplasm of skin: Secondary | ICD-10-CM | POA: Insufficient documentation

## 2016-12-06 DIAGNOSIS — Z923 Personal history of irradiation: Secondary | ICD-10-CM | POA: Diagnosis not present

## 2016-12-06 DIAGNOSIS — Z853 Personal history of malignant neoplasm of breast: Secondary | ICD-10-CM | POA: Diagnosis not present

## 2016-12-06 DIAGNOSIS — T8189XA Other complications of procedures, not elsewhere classified, initial encounter: Secondary | ICD-10-CM | POA: Diagnosis not present

## 2016-12-13 DIAGNOSIS — M810 Age-related osteoporosis without current pathological fracture: Secondary | ICD-10-CM | POA: Diagnosis not present

## 2016-12-13 DIAGNOSIS — L97212 Non-pressure chronic ulcer of right calf with fat layer exposed: Secondary | ICD-10-CM | POA: Diagnosis not present

## 2016-12-13 DIAGNOSIS — Z85828 Personal history of other malignant neoplasm of skin: Secondary | ICD-10-CM | POA: Diagnosis not present

## 2016-12-13 DIAGNOSIS — T8189XA Other complications of procedures, not elsewhere classified, initial encounter: Secondary | ICD-10-CM | POA: Diagnosis not present

## 2016-12-13 DIAGNOSIS — Z9221 Personal history of antineoplastic chemotherapy: Secondary | ICD-10-CM | POA: Diagnosis not present

## 2016-12-13 DIAGNOSIS — Z853 Personal history of malignant neoplasm of breast: Secondary | ICD-10-CM | POA: Diagnosis not present

## 2016-12-13 DIAGNOSIS — I890 Lymphedema, not elsewhere classified: Secondary | ICD-10-CM | POA: Diagnosis not present

## 2016-12-20 DIAGNOSIS — L97212 Non-pressure chronic ulcer of right calf with fat layer exposed: Secondary | ICD-10-CM | POA: Diagnosis not present

## 2016-12-20 DIAGNOSIS — T8189XA Other complications of procedures, not elsewhere classified, initial encounter: Secondary | ICD-10-CM | POA: Diagnosis not present

## 2016-12-20 DIAGNOSIS — Z853 Personal history of malignant neoplasm of breast: Secondary | ICD-10-CM | POA: Diagnosis not present

## 2016-12-20 DIAGNOSIS — Z9221 Personal history of antineoplastic chemotherapy: Secondary | ICD-10-CM | POA: Diagnosis not present

## 2016-12-20 DIAGNOSIS — I890 Lymphedema, not elsewhere classified: Secondary | ICD-10-CM | POA: Diagnosis not present

## 2016-12-20 DIAGNOSIS — Z85828 Personal history of other malignant neoplasm of skin: Secondary | ICD-10-CM | POA: Diagnosis not present

## 2016-12-20 DIAGNOSIS — M810 Age-related osteoporosis without current pathological fracture: Secondary | ICD-10-CM | POA: Diagnosis not present

## 2016-12-27 DIAGNOSIS — I890 Lymphedema, not elsewhere classified: Secondary | ICD-10-CM | POA: Diagnosis not present

## 2016-12-27 DIAGNOSIS — L97212 Non-pressure chronic ulcer of right calf with fat layer exposed: Secondary | ICD-10-CM | POA: Diagnosis not present

## 2016-12-27 DIAGNOSIS — Z9221 Personal history of antineoplastic chemotherapy: Secondary | ICD-10-CM | POA: Diagnosis not present

## 2016-12-27 DIAGNOSIS — Z85828 Personal history of other malignant neoplasm of skin: Secondary | ICD-10-CM | POA: Diagnosis not present

## 2016-12-27 DIAGNOSIS — Z853 Personal history of malignant neoplasm of breast: Secondary | ICD-10-CM | POA: Diagnosis not present

## 2016-12-27 DIAGNOSIS — M810 Age-related osteoporosis without current pathological fracture: Secondary | ICD-10-CM | POA: Diagnosis not present

## 2016-12-27 DIAGNOSIS — S81801D Unspecified open wound, right lower leg, subsequent encounter: Secondary | ICD-10-CM | POA: Diagnosis not present

## 2017-01-11 DIAGNOSIS — Z08 Encounter for follow-up examination after completed treatment for malignant neoplasm: Secondary | ICD-10-CM | POA: Diagnosis not present

## 2017-01-11 DIAGNOSIS — L820 Inflamed seborrheic keratosis: Secondary | ICD-10-CM | POA: Diagnosis not present

## 2017-01-11 DIAGNOSIS — Z85828 Personal history of other malignant neoplasm of skin: Secondary | ICD-10-CM | POA: Diagnosis not present

## 2017-01-16 DIAGNOSIS — H35363 Drusen (degenerative) of macula, bilateral: Secondary | ICD-10-CM | POA: Diagnosis not present

## 2017-01-16 DIAGNOSIS — H353211 Exudative age-related macular degeneration, right eye, with active choroidal neovascularization: Secondary | ICD-10-CM | POA: Diagnosis not present

## 2017-01-16 DIAGNOSIS — H43811 Vitreous degeneration, right eye: Secondary | ICD-10-CM | POA: Diagnosis not present

## 2017-01-16 DIAGNOSIS — H353133 Nonexudative age-related macular degeneration, bilateral, advanced atrophic without subfoveal involvement: Secondary | ICD-10-CM | POA: Diagnosis not present

## 2017-01-19 DIAGNOSIS — H353211 Exudative age-related macular degeneration, right eye, with active choroidal neovascularization: Secondary | ICD-10-CM | POA: Diagnosis not present

## 2017-02-16 DIAGNOSIS — H353211 Exudative age-related macular degeneration, right eye, with active choroidal neovascularization: Secondary | ICD-10-CM | POA: Diagnosis not present

## 2017-02-16 DIAGNOSIS — H353133 Nonexudative age-related macular degeneration, bilateral, advanced atrophic without subfoveal involvement: Secondary | ICD-10-CM | POA: Diagnosis not present

## 2017-02-16 DIAGNOSIS — H43811 Vitreous degeneration, right eye: Secondary | ICD-10-CM | POA: Diagnosis not present

## 2017-03-10 DIAGNOSIS — Z23 Encounter for immunization: Secondary | ICD-10-CM | POA: Diagnosis not present

## 2017-03-22 DIAGNOSIS — L821 Other seborrheic keratosis: Secondary | ICD-10-CM | POA: Diagnosis not present

## 2017-03-22 DIAGNOSIS — C44319 Basal cell carcinoma of skin of other parts of face: Secondary | ICD-10-CM | POA: Diagnosis not present

## 2017-03-22 DIAGNOSIS — X32XXXD Exposure to sunlight, subsequent encounter: Secondary | ICD-10-CM | POA: Diagnosis not present

## 2017-03-22 DIAGNOSIS — L570 Actinic keratosis: Secondary | ICD-10-CM | POA: Diagnosis not present

## 2017-03-23 DIAGNOSIS — H353211 Exudative age-related macular degeneration, right eye, with active choroidal neovascularization: Secondary | ICD-10-CM | POA: Diagnosis not present

## 2017-03-31 DIAGNOSIS — Z961 Presence of intraocular lens: Secondary | ICD-10-CM | POA: Diagnosis not present

## 2017-03-31 DIAGNOSIS — H40013 Open angle with borderline findings, low risk, bilateral: Secondary | ICD-10-CM | POA: Diagnosis not present

## 2017-03-31 DIAGNOSIS — H353122 Nonexudative age-related macular degeneration, left eye, intermediate dry stage: Secondary | ICD-10-CM | POA: Diagnosis not present

## 2017-03-31 DIAGNOSIS — H353211 Exudative age-related macular degeneration, right eye, with active choroidal neovascularization: Secondary | ICD-10-CM | POA: Diagnosis not present

## 2017-05-01 DIAGNOSIS — Z08 Encounter for follow-up examination after completed treatment for malignant neoplasm: Secondary | ICD-10-CM | POA: Diagnosis not present

## 2017-05-01 DIAGNOSIS — Z85828 Personal history of other malignant neoplasm of skin: Secondary | ICD-10-CM | POA: Diagnosis not present

## 2017-05-24 DIAGNOSIS — H1851 Endothelial corneal dystrophy: Secondary | ICD-10-CM | POA: Diagnosis not present

## 2017-05-24 DIAGNOSIS — H353133 Nonexudative age-related macular degeneration, bilateral, advanced atrophic without subfoveal involvement: Secondary | ICD-10-CM | POA: Diagnosis not present

## 2017-05-24 DIAGNOSIS — H43811 Vitreous degeneration, right eye: Secondary | ICD-10-CM | POA: Diagnosis not present

## 2017-05-24 DIAGNOSIS — H353212 Exudative age-related macular degeneration, right eye, with inactive choroidal neovascularization: Secondary | ICD-10-CM | POA: Diagnosis not present

## 2017-06-29 DIAGNOSIS — H43811 Vitreous degeneration, right eye: Secondary | ICD-10-CM | POA: Diagnosis not present

## 2017-06-29 DIAGNOSIS — H353133 Nonexudative age-related macular degeneration, bilateral, advanced atrophic without subfoveal involvement: Secondary | ICD-10-CM | POA: Diagnosis not present

## 2017-06-29 DIAGNOSIS — H353212 Exudative age-related macular degeneration, right eye, with inactive choroidal neovascularization: Secondary | ICD-10-CM | POA: Diagnosis not present

## 2017-08-31 DIAGNOSIS — H43811 Vitreous degeneration, right eye: Secondary | ICD-10-CM | POA: Diagnosis not present

## 2017-08-31 DIAGNOSIS — H353133 Nonexudative age-related macular degeneration, bilateral, advanced atrophic without subfoveal involvement: Secondary | ICD-10-CM | POA: Diagnosis not present

## 2017-08-31 DIAGNOSIS — H1851 Endothelial corneal dystrophy: Secondary | ICD-10-CM | POA: Diagnosis not present

## 2017-08-31 DIAGNOSIS — H353211 Exudative age-related macular degeneration, right eye, with active choroidal neovascularization: Secondary | ICD-10-CM | POA: Diagnosis not present

## 2017-10-09 DIAGNOSIS — H353211 Exudative age-related macular degeneration, right eye, with active choroidal neovascularization: Secondary | ICD-10-CM | POA: Diagnosis not present

## 2017-10-09 DIAGNOSIS — H43811 Vitreous degeneration, right eye: Secondary | ICD-10-CM | POA: Diagnosis not present

## 2017-10-09 DIAGNOSIS — H353133 Nonexudative age-related macular degeneration, bilateral, advanced atrophic without subfoveal involvement: Secondary | ICD-10-CM | POA: Diagnosis not present

## 2017-11-01 DIAGNOSIS — C44319 Basal cell carcinoma of skin of other parts of face: Secondary | ICD-10-CM | POA: Diagnosis not present

## 2017-11-01 DIAGNOSIS — Z85828 Personal history of other malignant neoplasm of skin: Secondary | ICD-10-CM | POA: Diagnosis not present

## 2017-11-01 DIAGNOSIS — Z08 Encounter for follow-up examination after completed treatment for malignant neoplasm: Secondary | ICD-10-CM | POA: Diagnosis not present

## 2017-11-29 DIAGNOSIS — Z08 Encounter for follow-up examination after completed treatment for malignant neoplasm: Secondary | ICD-10-CM | POA: Diagnosis not present

## 2017-11-29 DIAGNOSIS — Z85828 Personal history of other malignant neoplasm of skin: Secondary | ICD-10-CM | POA: Diagnosis not present

## 2017-12-04 DIAGNOSIS — H353133 Nonexudative age-related macular degeneration, bilateral, advanced atrophic without subfoveal involvement: Secondary | ICD-10-CM | POA: Diagnosis not present

## 2017-12-04 DIAGNOSIS — H43811 Vitreous degeneration, right eye: Secondary | ICD-10-CM | POA: Diagnosis not present

## 2017-12-04 DIAGNOSIS — H353211 Exudative age-related macular degeneration, right eye, with active choroidal neovascularization: Secondary | ICD-10-CM | POA: Diagnosis not present

## 2018-02-06 DIAGNOSIS — H353133 Nonexudative age-related macular degeneration, bilateral, advanced atrophic without subfoveal involvement: Secondary | ICD-10-CM | POA: Diagnosis not present

## 2018-02-06 DIAGNOSIS — H43811 Vitreous degeneration, right eye: Secondary | ICD-10-CM | POA: Diagnosis not present

## 2018-02-06 DIAGNOSIS — H353211 Exudative age-related macular degeneration, right eye, with active choroidal neovascularization: Secondary | ICD-10-CM | POA: Diagnosis not present

## 2018-03-16 DIAGNOSIS — Z23 Encounter for immunization: Secondary | ICD-10-CM | POA: Diagnosis not present

## 2018-04-02 DIAGNOSIS — H353122 Nonexudative age-related macular degeneration, left eye, intermediate dry stage: Secondary | ICD-10-CM | POA: Diagnosis not present

## 2018-04-02 DIAGNOSIS — H353211 Exudative age-related macular degeneration, right eye, with active choroidal neovascularization: Secondary | ICD-10-CM | POA: Diagnosis not present

## 2018-04-02 DIAGNOSIS — Z961 Presence of intraocular lens: Secondary | ICD-10-CM | POA: Diagnosis not present

## 2018-04-02 DIAGNOSIS — H40013 Open angle with borderline findings, low risk, bilateral: Secondary | ICD-10-CM | POA: Diagnosis not present

## 2018-04-10 DIAGNOSIS — H43811 Vitreous degeneration, right eye: Secondary | ICD-10-CM | POA: Diagnosis not present

## 2018-04-10 DIAGNOSIS — H353133 Nonexudative age-related macular degeneration, bilateral, advanced atrophic without subfoveal involvement: Secondary | ICD-10-CM | POA: Diagnosis not present

## 2018-04-10 DIAGNOSIS — H1851 Endothelial corneal dystrophy: Secondary | ICD-10-CM | POA: Diagnosis not present

## 2018-04-10 DIAGNOSIS — H353211 Exudative age-related macular degeneration, right eye, with active choroidal neovascularization: Secondary | ICD-10-CM | POA: Diagnosis not present

## 2018-05-28 DIAGNOSIS — Z08 Encounter for follow-up examination after completed treatment for malignant neoplasm: Secondary | ICD-10-CM | POA: Diagnosis not present

## 2018-05-28 DIAGNOSIS — Z85828 Personal history of other malignant neoplasm of skin: Secondary | ICD-10-CM | POA: Diagnosis not present

## 2018-06-12 DIAGNOSIS — H353133 Nonexudative age-related macular degeneration, bilateral, advanced atrophic without subfoveal involvement: Secondary | ICD-10-CM | POA: Diagnosis not present

## 2018-06-12 DIAGNOSIS — H35363 Drusen (degenerative) of macula, bilateral: Secondary | ICD-10-CM | POA: Diagnosis not present

## 2018-06-12 DIAGNOSIS — H353211 Exudative age-related macular degeneration, right eye, with active choroidal neovascularization: Secondary | ICD-10-CM | POA: Diagnosis not present

## 2018-06-12 DIAGNOSIS — H43811 Vitreous degeneration, right eye: Secondary | ICD-10-CM | POA: Diagnosis not present

## 2018-08-02 ENCOUNTER — Encounter: Payer: Self-pay | Admitting: Emergency Medicine

## 2018-08-02 ENCOUNTER — Emergency Department (HOSPITAL_COMMUNITY): Payer: Medicare Other

## 2018-08-02 ENCOUNTER — Emergency Department (HOSPITAL_COMMUNITY)
Admission: EM | Admit: 2018-08-02 | Discharge: 2018-08-02 | Disposition: A | Discharge status: Home / Self Care | Payer: Medicare Other | Attending: Emergency Medicine | Admitting: Emergency Medicine

## 2018-08-02 DIAGNOSIS — Y939 Activity, unspecified: Secondary | ICD-10-CM | POA: Diagnosis not present

## 2018-08-02 DIAGNOSIS — M7918 Myalgia, other site: Secondary | ICD-10-CM | POA: Diagnosis not present

## 2018-08-02 DIAGNOSIS — M25562 Pain in left knee: Secondary | ICD-10-CM | POA: Diagnosis not present

## 2018-08-02 DIAGNOSIS — R0902 Hypoxemia: Secondary | ICD-10-CM | POA: Diagnosis not present

## 2018-08-02 DIAGNOSIS — Z23 Encounter for immunization: Secondary | ICD-10-CM | POA: Insufficient documentation

## 2018-08-02 DIAGNOSIS — R52 Pain, unspecified: Secondary | ICD-10-CM | POA: Diagnosis not present

## 2018-08-02 DIAGNOSIS — W010XXA Fall on same level from slipping, tripping and stumbling without subsequent striking against object, initial encounter: Secondary | ICD-10-CM | POA: Insufficient documentation

## 2018-08-02 DIAGNOSIS — S99911A Unspecified injury of right ankle, initial encounter: Secondary | ICD-10-CM | POA: Diagnosis not present

## 2018-08-02 DIAGNOSIS — M25571 Pain in right ankle and joints of right foot: Secondary | ICD-10-CM | POA: Diagnosis not present

## 2018-08-02 DIAGNOSIS — W19XXXA Unspecified fall, initial encounter: Secondary | ICD-10-CM

## 2018-08-02 DIAGNOSIS — T148XXA Other injury of unspecified body region, initial encounter: Secondary | ICD-10-CM | POA: Insufficient documentation

## 2018-08-02 DIAGNOSIS — I10 Essential (primary) hypertension: Secondary | ICD-10-CM | POA: Diagnosis not present

## 2018-08-02 DIAGNOSIS — Z853 Personal history of malignant neoplasm of breast: Secondary | ICD-10-CM | POA: Diagnosis not present

## 2018-08-02 DIAGNOSIS — Y999 Unspecified external cause status: Secondary | ICD-10-CM | POA: Insufficient documentation

## 2018-08-02 DIAGNOSIS — S8992XA Unspecified injury of left lower leg, initial encounter: Secondary | ICD-10-CM | POA: Diagnosis not present

## 2018-08-02 DIAGNOSIS — M79662 Pain in left lower leg: Secondary | ICD-10-CM | POA: Diagnosis not present

## 2018-08-02 DIAGNOSIS — Y92481 Parking lot as the place of occurrence of the external cause: Secondary | ICD-10-CM | POA: Diagnosis not present

## 2018-08-02 DIAGNOSIS — S99921A Unspecified injury of right foot, initial encounter: Secondary | ICD-10-CM | POA: Diagnosis not present

## 2018-08-02 DIAGNOSIS — S8991XA Unspecified injury of right lower leg, initial encounter: Secondary | ICD-10-CM | POA: Diagnosis not present

## 2018-08-02 DIAGNOSIS — M79661 Pain in right lower leg: Secondary | ICD-10-CM | POA: Diagnosis not present

## 2018-08-02 DIAGNOSIS — M79604 Pain in right leg: Secondary | ICD-10-CM | POA: Diagnosis not present

## 2018-08-02 HISTORY — DX: Malignant (primary) neoplasm, unspecified: C80.1

## 2018-08-02 MED ORDER — ACETAMINOPHEN 325 MG PO TABS
650.0000 mg | ORAL_TABLET | Freq: Once | ORAL | Status: AC
  Administered 2018-08-02: 650 mg via ORAL
  Filled 2018-08-02: qty 2

## 2018-08-02 MED ORDER — TETANUS-DIPHTH-ACELL PERTUSSIS 5-2.5-18.5 LF-MCG/0.5 IM SUSP
0.5000 mL | Freq: Once | INTRAMUSCULAR | Status: AC
  Administered 2018-08-02: 0.5 mL via INTRAMUSCULAR
  Filled 2018-08-02: qty 0.5

## 2018-08-02 NOTE — ED Notes (Signed)
Bed: [facility] Expected date:  Expected time:  Means of arrival:  Comments: EMS-fall-ankle pain

## 2018-08-02 NOTE — ED Triage Notes (Signed)
Per EMS: Pt fell in the parking lot and caught her right foot underneath her. Pt is coming from home. Pt is alert and oriented at this time. Pt reportedly lives by herself. Pt has a restricted right extremity.

## 2018-08-02 NOTE — ED Provider Notes (Signed)
[place] DEPT Provider Note   CSN: [id number] Arrival date & time: 08/02/18  1247    History   Chief Complaint Chief Complaint  Patient presents with  . Fall  . Ankle Pain    HPI Sierra Allen is a 85 y.o. female with a history of breast cancer in remission who presents to the emergency department via EMS status post mechanical fall shortly PTA with complaints of right ankle and left knee pain.  Patient states she was ambulating in the parking lot when she tripped over her feet and fell forward onto the pavement.  She states that she rolled her right ankle and landed on the left knee and right hand.  She denies head injury or loss of consciousness.  She states that she is having pain primarily to the right ankle as well as to the left knee.  She states she does have a wound to the palm of the right hand that is not painful.  Her current discomfort is mild in severity without specific alleviating or aggravating factors.  She has not attempted to bear weight on the right ankle thus far.  She states she normally does not walk with a cane or walker but does have a walker for as needed use.  She denies head injury, loss of consciousness, neck pain, back pain, hip pain, nausea, vomiting, chest pain, abdominal pain, or blood thinner use.  No prodromal lightheadedness, dizziness, chest pain, or dyspnea- also not having any of these symptoms at present.  Her last tetanus was greater than 10 years ago.     HPI  Past Medical History:  Diagnosis Date  . Cancer [facility])    Brest Cancer    There are no active problems to display for this patient.   Past Surgical History:  Procedure Laterality Date  . BACK SURGERY    . Femur Rod    . LYMPHADENECTOMY     Right arm  . TONSILLECTOMY       OB History    Gravida      Para      Term      Preterm      AB      Living  2     SAB      TAB      Ectopic      Multiple      Live Births                 Home Medications    Prior to Admission medications   Not on File    Family History No family history on file.  Social History Social History   Tobacco Use  . Smoking status: Never Smoker  Substance Use Topics  . Alcohol use: Not Currently  . Drug use: Not Currently     Allergies   Penicillins   Review of Systems Review of Systems  Constitutional: Negative for chills and fever.  Respiratory: Negative for shortness of breath.   Cardiovascular: Negative for chest pain.  Gastrointestinal: Negative for nausea and vomiting.  Musculoskeletal: Positive for arthralgias (R ankle, L knee).  Skin: Positive for wound (R hand, L knee).  Neurological: Negative for dizziness, syncope, weakness, light-headedness, numbness and headaches.  All other systems reviewed and are negative.    Physical Exam Updated Vital Signs BP (!) 151/94 (BP Location: Right Arm)   Pulse (!) 105   Temp (!) 97.5 F (36.4 C)   Resp 18   SpO2 91%  Physical Exam Vitals signs and nursing note reviewed.  Constitutional:      General: She is not in acute distress.    Appearance: She is well-developed. She is not toxic-appearing.  HENT:     Head: Normocephalic and atraumatic.     Comments: No raccoon eyes or battle sign.    Ears:     Comments: No hemotympanum.    Mouth/Throat:     Comments: Uvula midline Eyes:     General:        Right eye: No discharge.        Left eye: No discharge.     Conjunctiva/sclera: Conjunctivae normal.  Neck:     Musculoskeletal: Normal range of motion and neck supple.     Comments: No midline cervical tenderness or palpable step-off. Cardiovascular:     Rate and Rhythm: Regular rhythm. Tachycardia present.  Pulmonary:     Effort: Pulmonary effort is normal. No tachypnea or respiratory distress.     Breath sounds: Normal breath sounds. No wheezing, rhonchi or rales.     Comments: No bruising to chest or abdomen. Chest:     Chest wall: No tenderness.   Abdominal:     General: There is no distension.     Palpations: Abdomen is soft.     Tenderness: There is no abdominal tenderness.  Musculoskeletal:     Comments: Patient has a 1 cm skin tear noted to the right thenar eminence without active bleeding or appreciable foreign body, there is a bandage in place.  She has abrasions noted to the left anterior knee.  No active bleeding or appreciable foreign body.  No significantly deep wounds noted.  No ecchymosis, obvious deformity, or significant swelling noted throughout. Upper extremity: Patient has normal active range of motion throughout.  There is no point/focal bony tenderness.  There is no anatomical snuffbox tenderness.  Specifically no tenderness over area of skin tear.  Back: No midline tenderness to the cervical, thoracic, or lumbar region. Lower extremities: Patient has full active range of motion to bilateral hips, knees, ankles, and all digits.  She is tender to palpation over the left anterior knee, otherwise left lower extremity is nontender.  Right lower extremity is tender over the mid/forefoot in the medial and lateral ankle diffusely without point/focal bony tenderness.  She is also somewhat tender over the distal third of the tib/fib.  No fibular head tenderness.  No tenderness to the hips.  Skin:    General: Skin is warm and dry.     Findings: No rash.  Neurological:     Mental Status: She is alert.     Comments: Clear speech.  CN III through XII grossly intact.  Sensation grossly intact bilateral upper and lower extremities.  5 out of 5 symmetric grip strength.  5 out of 5 strength with plantar dorsiflexion bilaterally.  Ambulation deferred upon initial assessment.  Psychiatric:        Behavior: Behavior normal.     ED Treatments / Results  Labs (all labs ordered are listed, but only abnormal results are displayed) Labs Reviewed - No data to display  EKG None  Radiology Dg Tibia/fibula Right  Result Date:  08/02/2018 CLINICAL DATA:  Fall today; diffuse right lower leg pain; patient just stated her lower leg was aching EXAM: RIGHT TIBIA AND FIBULA - 2 VIEW COMPARISON:  None. FINDINGS: No fracture.  No bone lesion. Knee and ankle joints are normally aligned. Skeletal structures are diffusely demineralized. There are scattered vascular  calcifications. Soft tissues are otherwise unremarkable. IMPRESSION: No fracture, bone lesion or dislocation. Electronically Signed   By: Lajean Manes M.D.   On: 08/02/2018 15:01   Dg Ankle Complete Right  Result Date: 08/02/2018 CLINICAL DATA:  Fall today.  Diffuse right leg pain. EXAM: RIGHT ANKLE - COMPLETE 3+ VIEW COMPARISON:  None. FINDINGS: No fracture.  No bone lesion.  Bones are diffusely demineralized. Ankle joint is normally spaced and aligned. Soft tissues are unremarkable. IMPRESSION: No fracture or dislocation. Electronically Signed   By: Lajean Manes M.D.   On: 08/02/2018 14:59   Dg Knee Complete 4 Views Left  Result Date: 08/02/2018 CLINICAL DATA:  Fall today; diffuse right lower leg pain; patient just stated her lower leg was aching EXAM: LEFT KNEE - COMPLETE 4+ VIEW COMPARISON:  None. FINDINGS: No fracture.  No bone lesion. Mild narrowing of the lateral joint space compartment. No other arthropathic/degenerative change. No joint effusion. There scattered posterior arterial vascular calcifications. Soft tissues are otherwise unremarkable. IMPRESSION: No fracture or dislocation. Electronically Signed   By: Lajean Manes M.D.   On: 08/02/2018 15:03   Dg Foot Complete Right  Result Date: 08/02/2018 CLINICAL DATA:  Fall today; diffuse right lower leg pain; patient just stated her lower leg was aching EXAM: RIGHT FOOT COMPLETE - 3+ VIEW COMPARISON:  None. FINDINGS: No fracture or bone lesion. The joints are normally aligned. Skeletal structures are diffusely demineralized. Soft tissues are unremarkable. Small plantar calcaneal spur. IMPRESSION: No fracture or  dislocation.  No acute finding. Electronically Signed   By: Lajean Manes M.D.   On: 08/02/2018 15:00    Procedures Procedures (including critical care time)  SPLINT APPLICATION Date/Time: 9:67 PM Authorized by: Kennith Maes Consent: Verbal consent obtained. Risks and benefits: risks, benefits and alternatives were discussed Consent given by: patient Splint applied by: orthopedic technician Location details: RLE Splint type: ASO Supplies used: ASO Post-procedure: The splinted body part was neurovascularly unchanged following the procedure. Patient tolerance: Patient tolerated the procedure well with no immediate complications.  Medications Ordered in ED Medications  acetaminophen (TYLENOL) tablet 650 mg (650 mg Oral Given 08/02/18 1355)  Tdap (BOOSTRIX) injection 0.5 mL (0.5 mLs Intramuscular Given 08/02/18 1354)     Initial Impression / Assessment and Plan / ED Course  I have reviewed the triage vital signs and the nursing notes.  Pertinent labs & imaging results that were available during my care of the patient were reviewed by me and considered in my medical decision making (see chart for details).   Patient presents to the emergency department status post mechanical fall with complaints of right ankle and left knee pain.  Patient is nontoxic-appearing, no apparent distress, her vitals are notable for mild tachycardia and oxygen saturation that seems to hover between 90 to 94% on my exam.  No evidence of serious head, neck, or back injury, no neuro deficits or point/focal vertebral tenderness.  Patient is not on anticoagulation.  Imaging obtained in areas of discomfort on musculoskeletal exam.  X-rays negative for fracture dislocation.  Patient is neurovascularly intact distal to all areas of injury.  Her wounds were cleaned and do not appear to require intervention with sutures, staples, or skin adhesive.  Her tetanus was updated in the ER.  She is able to ambulate with ankle  ASO in place and is requesting to go home. Her oxygen remains in the low 90s and she does remain mildly tachycardic- She has no complaints of chest injury, no abrasion or  ecchymosis noted to the chest wall, chest wall is nontender to palpation, lungs are clear to auscultation bilaterally. Do not suspect chest injury causing mild VS abnormalities.  Additionally do not suspect pneumonia with clear lungs and no complaints of cough or fever by the patient. She is adamantly requesting discharge. Discussed findings and plan of care with supervising physician Dr. Tomi Bamberger- in agreement with discharge home with PCP follow up. I discussed results, treatment plan, need for follow-up, and return precautions with the patient. Provided opportunity for questions, patient confirmed understanding and is in agreement with plan.   Blood pressure 139/89, pulse (!) 102, temperature (!) 97.5 F (36.4 C), resp. rate 18, SpO2 92 %. Final Clinical Impressions(s) / ED Diagnoses   Final diagnoses:  Fall, initial encounter    ED Discharge Orders    None       Leafy Kindle 08/02/18 1630    Dorie Rank, MD 08/04/18 2220

## 2018-08-02 NOTE — Discharge Instructions (Addendum)
Please read and follow all provided instructions.  You have been seen today for a fall.   Tests performed today include: X-ray of the affected area - do NOT show any broken bones or dislocations.   Home care instructions: -- *PRICE in the first 24-48 hours after injury: Protect (with brace, splint, sling), if given by your provider Rest Ice- Do not apply ice pack directly to your skin, place towel or similar between your skin and ice/ice pack. Apply ice for 20 min, then remove for 40 min while awake Compression- Wear brace, elastic bandage, splint as directed by your provider Elevate affected extremity above the level of your heart when not walking around for the first 24-48 hours   Medications:  Please take tylenol per over the counter dosing  Follow-up instructions: Please follow-up with your primary care provider within 3 days.   Return instructions:  Please return if your digits or extremity are numb or tingling, appear gray or blue, or you have severe pain (also elevate the extremity and loosen splint or wrap if you were given one) Please return if you have redness or fevers.  Please return to the Emergency Department if you experience worsening symptoms.  Please return if you have any other emergent concerns.  Additional Information:  Your vital signs today were: Your oxygen became a bit low throughout your ER stay, the sending to follow-up with a primary care provider about within the next 2 to 3 days.  Please also follow-up for reassessment of your injuries.

## 2018-08-14 DIAGNOSIS — H353133 Nonexudative age-related macular degeneration, bilateral, advanced atrophic without subfoveal involvement: Secondary | ICD-10-CM | POA: Diagnosis not present

## 2018-08-14 DIAGNOSIS — H353211 Exudative age-related macular degeneration, right eye, with active choroidal neovascularization: Secondary | ICD-10-CM | POA: Diagnosis not present

## 2018-08-20 DIAGNOSIS — S8011XA Contusion of right lower leg, initial encounter: Secondary | ICD-10-CM | POA: Diagnosis not present

## 2018-08-27 DIAGNOSIS — S8011XD Contusion of right lower leg, subsequent encounter: Secondary | ICD-10-CM | POA: Diagnosis not present

## 2018-10-18 DIAGNOSIS — R2681 Unsteadiness on feet: Secondary | ICD-10-CM | POA: Diagnosis not present

## 2018-10-22 DIAGNOSIS — M6281 Muscle weakness (generalized): Secondary | ICD-10-CM | POA: Diagnosis not present

## 2018-10-22 DIAGNOSIS — R278 Other lack of coordination: Secondary | ICD-10-CM | POA: Diagnosis not present

## 2018-10-22 DIAGNOSIS — R2689 Other abnormalities of gait and mobility: Secondary | ICD-10-CM | POA: Diagnosis not present

## 2018-10-22 DIAGNOSIS — R2681 Unsteadiness on feet: Secondary | ICD-10-CM | POA: Diagnosis not present

## 2018-10-22 DIAGNOSIS — Z9181 History of falling: Secondary | ICD-10-CM | POA: Diagnosis not present

## 2018-10-25 DIAGNOSIS — Z9181 History of falling: Secondary | ICD-10-CM | POA: Diagnosis not present

## 2018-10-25 DIAGNOSIS — M6281 Muscle weakness (generalized): Secondary | ICD-10-CM | POA: Diagnosis not present

## 2018-10-25 DIAGNOSIS — R2681 Unsteadiness on feet: Secondary | ICD-10-CM | POA: Diagnosis not present

## 2018-10-25 DIAGNOSIS — R278 Other lack of coordination: Secondary | ICD-10-CM | POA: Diagnosis not present

## 2018-10-25 DIAGNOSIS — R2689 Other abnormalities of gait and mobility: Secondary | ICD-10-CM | POA: Diagnosis not present

## 2018-11-01 DIAGNOSIS — M6281 Muscle weakness (generalized): Secondary | ICD-10-CM | POA: Diagnosis not present

## 2018-11-01 DIAGNOSIS — Z9181 History of falling: Secondary | ICD-10-CM | POA: Diagnosis not present

## 2018-11-01 DIAGNOSIS — R2681 Unsteadiness on feet: Secondary | ICD-10-CM | POA: Diagnosis not present

## 2018-11-01 DIAGNOSIS — R2689 Other abnormalities of gait and mobility: Secondary | ICD-10-CM | POA: Diagnosis not present

## 2018-11-01 DIAGNOSIS — R278 Other lack of coordination: Secondary | ICD-10-CM | POA: Diagnosis not present

## 2018-11-02 DIAGNOSIS — R2689 Other abnormalities of gait and mobility: Secondary | ICD-10-CM | POA: Diagnosis not present

## 2018-11-02 DIAGNOSIS — M6281 Muscle weakness (generalized): Secondary | ICD-10-CM | POA: Diagnosis not present

## 2018-11-02 DIAGNOSIS — R2681 Unsteadiness on feet: Secondary | ICD-10-CM | POA: Diagnosis not present

## 2018-11-02 DIAGNOSIS — Z9181 History of falling: Secondary | ICD-10-CM | POA: Diagnosis not present

## 2018-11-02 DIAGNOSIS — R278 Other lack of coordination: Secondary | ICD-10-CM | POA: Diagnosis not present

## 2018-11-05 DIAGNOSIS — R2689 Other abnormalities of gait and mobility: Secondary | ICD-10-CM | POA: Diagnosis not present

## 2018-11-05 DIAGNOSIS — M6281 Muscle weakness (generalized): Secondary | ICD-10-CM | POA: Diagnosis not present

## 2018-11-05 DIAGNOSIS — R278 Other lack of coordination: Secondary | ICD-10-CM | POA: Diagnosis not present

## 2018-11-05 DIAGNOSIS — R2681 Unsteadiness on feet: Secondary | ICD-10-CM | POA: Diagnosis not present

## 2018-11-05 DIAGNOSIS — Z9181 History of falling: Secondary | ICD-10-CM | POA: Diagnosis not present

## 2018-11-06 DIAGNOSIS — M6281 Muscle weakness (generalized): Secondary | ICD-10-CM | POA: Diagnosis not present

## 2018-11-06 DIAGNOSIS — Z9181 History of falling: Secondary | ICD-10-CM | POA: Diagnosis not present

## 2018-11-06 DIAGNOSIS — R278 Other lack of coordination: Secondary | ICD-10-CM | POA: Diagnosis not present

## 2018-11-06 DIAGNOSIS — R2681 Unsteadiness on feet: Secondary | ICD-10-CM | POA: Diagnosis not present

## 2018-11-06 DIAGNOSIS — R2689 Other abnormalities of gait and mobility: Secondary | ICD-10-CM | POA: Diagnosis not present

## 2018-11-07 DIAGNOSIS — R2681 Unsteadiness on feet: Secondary | ICD-10-CM | POA: Diagnosis not present

## 2018-11-07 DIAGNOSIS — Z9181 History of falling: Secondary | ICD-10-CM | POA: Diagnosis not present

## 2018-11-07 DIAGNOSIS — M6281 Muscle weakness (generalized): Secondary | ICD-10-CM | POA: Diagnosis not present

## 2018-11-07 DIAGNOSIS — R2689 Other abnormalities of gait and mobility: Secondary | ICD-10-CM | POA: Diagnosis not present

## 2018-11-07 DIAGNOSIS — R278 Other lack of coordination: Secondary | ICD-10-CM | POA: Diagnosis not present

## 2018-11-08 DIAGNOSIS — R2681 Unsteadiness on feet: Secondary | ICD-10-CM | POA: Diagnosis not present

## 2018-11-08 DIAGNOSIS — R278 Other lack of coordination: Secondary | ICD-10-CM | POA: Diagnosis not present

## 2018-11-08 DIAGNOSIS — R2689 Other abnormalities of gait and mobility: Secondary | ICD-10-CM | POA: Diagnosis not present

## 2018-11-08 DIAGNOSIS — M6281 Muscle weakness (generalized): Secondary | ICD-10-CM | POA: Diagnosis not present

## 2018-11-08 DIAGNOSIS — Z9181 History of falling: Secondary | ICD-10-CM | POA: Diagnosis not present

## 2018-11-13 DIAGNOSIS — R2681 Unsteadiness on feet: Secondary | ICD-10-CM | POA: Diagnosis not present

## 2018-11-13 DIAGNOSIS — R278 Other lack of coordination: Secondary | ICD-10-CM | POA: Diagnosis not present

## 2018-11-13 DIAGNOSIS — R2689 Other abnormalities of gait and mobility: Secondary | ICD-10-CM | POA: Diagnosis not present

## 2018-11-13 DIAGNOSIS — Z9181 History of falling: Secondary | ICD-10-CM | POA: Diagnosis not present

## 2018-11-13 DIAGNOSIS — M6281 Muscle weakness (generalized): Secondary | ICD-10-CM | POA: Diagnosis not present

## 2018-11-14 DIAGNOSIS — Z9181 History of falling: Secondary | ICD-10-CM | POA: Diagnosis not present

## 2018-11-14 DIAGNOSIS — R278 Other lack of coordination: Secondary | ICD-10-CM | POA: Diagnosis not present

## 2018-11-14 DIAGNOSIS — M6281 Muscle weakness (generalized): Secondary | ICD-10-CM | POA: Diagnosis not present

## 2018-11-14 DIAGNOSIS — R2681 Unsteadiness on feet: Secondary | ICD-10-CM | POA: Diagnosis not present

## 2018-11-14 DIAGNOSIS — R2689 Other abnormalities of gait and mobility: Secondary | ICD-10-CM | POA: Diagnosis not present

## 2018-11-15 DIAGNOSIS — Z9181 History of falling: Secondary | ICD-10-CM | POA: Diagnosis not present

## 2018-11-15 DIAGNOSIS — R2681 Unsteadiness on feet: Secondary | ICD-10-CM | POA: Diagnosis not present

## 2018-11-15 DIAGNOSIS — R2689 Other abnormalities of gait and mobility: Secondary | ICD-10-CM | POA: Diagnosis not present

## 2018-11-15 DIAGNOSIS — R278 Other lack of coordination: Secondary | ICD-10-CM | POA: Diagnosis not present

## 2018-11-15 DIAGNOSIS — M6281 Muscle weakness (generalized): Secondary | ICD-10-CM | POA: Diagnosis not present

## 2018-11-16 DIAGNOSIS — Z9181 History of falling: Secondary | ICD-10-CM | POA: Diagnosis not present

## 2018-11-16 DIAGNOSIS — R2689 Other abnormalities of gait and mobility: Secondary | ICD-10-CM | POA: Diagnosis not present

## 2018-11-16 DIAGNOSIS — R2681 Unsteadiness on feet: Secondary | ICD-10-CM | POA: Diagnosis not present

## 2018-11-16 DIAGNOSIS — M6281 Muscle weakness (generalized): Secondary | ICD-10-CM | POA: Diagnosis not present

## 2018-11-16 DIAGNOSIS — R278 Other lack of coordination: Secondary | ICD-10-CM | POA: Diagnosis not present

## 2018-11-19 DIAGNOSIS — M6281 Muscle weakness (generalized): Secondary | ICD-10-CM | POA: Diagnosis not present

## 2018-11-19 DIAGNOSIS — R2681 Unsteadiness on feet: Secondary | ICD-10-CM | POA: Diagnosis not present

## 2018-11-19 DIAGNOSIS — R2689 Other abnormalities of gait and mobility: Secondary | ICD-10-CM | POA: Diagnosis not present

## 2018-11-19 DIAGNOSIS — R278 Other lack of coordination: Secondary | ICD-10-CM | POA: Diagnosis not present

## 2018-11-19 DIAGNOSIS — Z9181 History of falling: Secondary | ICD-10-CM | POA: Diagnosis not present

## 2018-11-21 DIAGNOSIS — Z9181 History of falling: Secondary | ICD-10-CM | POA: Diagnosis not present

## 2018-11-21 DIAGNOSIS — R2689 Other abnormalities of gait and mobility: Secondary | ICD-10-CM | POA: Diagnosis not present

## 2018-11-21 DIAGNOSIS — R2681 Unsteadiness on feet: Secondary | ICD-10-CM | POA: Diagnosis not present

## 2018-11-21 DIAGNOSIS — R278 Other lack of coordination: Secondary | ICD-10-CM | POA: Diagnosis not present

## 2018-11-21 DIAGNOSIS — M6281 Muscle weakness (generalized): Secondary | ICD-10-CM | POA: Diagnosis not present

## 2018-11-22 DIAGNOSIS — R2681 Unsteadiness on feet: Secondary | ICD-10-CM | POA: Diagnosis not present

## 2018-11-22 DIAGNOSIS — Z9181 History of falling: Secondary | ICD-10-CM | POA: Diagnosis not present

## 2018-11-22 DIAGNOSIS — R278 Other lack of coordination: Secondary | ICD-10-CM | POA: Diagnosis not present

## 2018-11-22 DIAGNOSIS — R2689 Other abnormalities of gait and mobility: Secondary | ICD-10-CM | POA: Diagnosis not present

## 2018-11-22 DIAGNOSIS — M6281 Muscle weakness (generalized): Secondary | ICD-10-CM | POA: Diagnosis not present

## 2018-11-23 DIAGNOSIS — R2681 Unsteadiness on feet: Secondary | ICD-10-CM | POA: Diagnosis not present

## 2018-11-23 DIAGNOSIS — R2689 Other abnormalities of gait and mobility: Secondary | ICD-10-CM | POA: Diagnosis not present

## 2018-11-23 DIAGNOSIS — Z9181 History of falling: Secondary | ICD-10-CM | POA: Diagnosis not present

## 2018-11-23 DIAGNOSIS — R278 Other lack of coordination: Secondary | ICD-10-CM | POA: Diagnosis not present

## 2018-11-23 DIAGNOSIS — M6281 Muscle weakness (generalized): Secondary | ICD-10-CM | POA: Diagnosis not present

## 2018-11-27 DIAGNOSIS — Z9181 History of falling: Secondary | ICD-10-CM | POA: Diagnosis not present

## 2018-11-27 DIAGNOSIS — R2689 Other abnormalities of gait and mobility: Secondary | ICD-10-CM | POA: Diagnosis not present

## 2018-11-27 DIAGNOSIS — R278 Other lack of coordination: Secondary | ICD-10-CM | POA: Diagnosis not present

## 2018-11-27 DIAGNOSIS — M6281 Muscle weakness (generalized): Secondary | ICD-10-CM | POA: Diagnosis not present

## 2018-11-27 DIAGNOSIS — R2681 Unsteadiness on feet: Secondary | ICD-10-CM | POA: Diagnosis not present

## 2018-11-28 DIAGNOSIS — M6281 Muscle weakness (generalized): Secondary | ICD-10-CM | POA: Diagnosis not present

## 2018-11-28 DIAGNOSIS — R2689 Other abnormalities of gait and mobility: Secondary | ICD-10-CM | POA: Diagnosis not present

## 2018-11-28 DIAGNOSIS — R278 Other lack of coordination: Secondary | ICD-10-CM | POA: Diagnosis not present

## 2018-11-28 DIAGNOSIS — Z9181 History of falling: Secondary | ICD-10-CM | POA: Diagnosis not present

## 2018-11-28 DIAGNOSIS — R2681 Unsteadiness on feet: Secondary | ICD-10-CM | POA: Diagnosis not present

## 2018-11-29 DIAGNOSIS — R2681 Unsteadiness on feet: Secondary | ICD-10-CM | POA: Diagnosis not present

## 2018-11-29 DIAGNOSIS — R2689 Other abnormalities of gait and mobility: Secondary | ICD-10-CM | POA: Diagnosis not present

## 2018-11-29 DIAGNOSIS — R278 Other lack of coordination: Secondary | ICD-10-CM | POA: Diagnosis not present

## 2018-11-29 DIAGNOSIS — Z9181 History of falling: Secondary | ICD-10-CM | POA: Diagnosis not present

## 2018-11-29 DIAGNOSIS — M6281 Muscle weakness (generalized): Secondary | ICD-10-CM | POA: Diagnosis not present

## 2018-11-30 DIAGNOSIS — R278 Other lack of coordination: Secondary | ICD-10-CM | POA: Diagnosis not present

## 2018-11-30 DIAGNOSIS — Z9181 History of falling: Secondary | ICD-10-CM | POA: Diagnosis not present

## 2018-11-30 DIAGNOSIS — R2689 Other abnormalities of gait and mobility: Secondary | ICD-10-CM | POA: Diagnosis not present

## 2018-11-30 DIAGNOSIS — R2681 Unsteadiness on feet: Secondary | ICD-10-CM | POA: Diagnosis not present

## 2018-11-30 DIAGNOSIS — M6281 Muscle weakness (generalized): Secondary | ICD-10-CM | POA: Diagnosis not present

## 2018-12-02 DIAGNOSIS — R2689 Other abnormalities of gait and mobility: Secondary | ICD-10-CM | POA: Diagnosis not present

## 2018-12-02 DIAGNOSIS — Z9181 History of falling: Secondary | ICD-10-CM | POA: Diagnosis not present

## 2018-12-02 DIAGNOSIS — R2681 Unsteadiness on feet: Secondary | ICD-10-CM | POA: Diagnosis not present

## 2018-12-02 DIAGNOSIS — M6281 Muscle weakness (generalized): Secondary | ICD-10-CM | POA: Diagnosis not present

## 2018-12-02 DIAGNOSIS — R278 Other lack of coordination: Secondary | ICD-10-CM | POA: Diagnosis not present

## 2018-12-04 DIAGNOSIS — R278 Other lack of coordination: Secondary | ICD-10-CM | POA: Diagnosis not present

## 2018-12-04 DIAGNOSIS — M6281 Muscle weakness (generalized): Secondary | ICD-10-CM | POA: Diagnosis not present

## 2018-12-04 DIAGNOSIS — R2681 Unsteadiness on feet: Secondary | ICD-10-CM | POA: Diagnosis not present

## 2018-12-04 DIAGNOSIS — R2689 Other abnormalities of gait and mobility: Secondary | ICD-10-CM | POA: Diagnosis not present

## 2018-12-04 DIAGNOSIS — Z9181 History of falling: Secondary | ICD-10-CM | POA: Diagnosis not present

## 2018-12-05 DIAGNOSIS — M6281 Muscle weakness (generalized): Secondary | ICD-10-CM | POA: Diagnosis not present

## 2018-12-05 DIAGNOSIS — Z9181 History of falling: Secondary | ICD-10-CM | POA: Diagnosis not present

## 2018-12-05 DIAGNOSIS — R2681 Unsteadiness on feet: Secondary | ICD-10-CM | POA: Diagnosis not present

## 2018-12-05 DIAGNOSIS — R2689 Other abnormalities of gait and mobility: Secondary | ICD-10-CM | POA: Diagnosis not present

## 2018-12-05 DIAGNOSIS — R278 Other lack of coordination: Secondary | ICD-10-CM | POA: Diagnosis not present

## 2018-12-06 DIAGNOSIS — Z9181 History of falling: Secondary | ICD-10-CM | POA: Diagnosis not present

## 2018-12-06 DIAGNOSIS — R2689 Other abnormalities of gait and mobility: Secondary | ICD-10-CM | POA: Diagnosis not present

## 2018-12-06 DIAGNOSIS — R278 Other lack of coordination: Secondary | ICD-10-CM | POA: Diagnosis not present

## 2018-12-06 DIAGNOSIS — R2681 Unsteadiness on feet: Secondary | ICD-10-CM | POA: Diagnosis not present

## 2018-12-06 DIAGNOSIS — M6281 Muscle weakness (generalized): Secondary | ICD-10-CM | POA: Diagnosis not present

## 2018-12-11 DIAGNOSIS — Z9181 History of falling: Secondary | ICD-10-CM | POA: Diagnosis not present

## 2018-12-11 DIAGNOSIS — M6281 Muscle weakness (generalized): Secondary | ICD-10-CM | POA: Diagnosis not present

## 2018-12-11 DIAGNOSIS — R278 Other lack of coordination: Secondary | ICD-10-CM | POA: Diagnosis not present

## 2018-12-11 DIAGNOSIS — R2681 Unsteadiness on feet: Secondary | ICD-10-CM | POA: Diagnosis not present

## 2018-12-11 DIAGNOSIS — R2689 Other abnormalities of gait and mobility: Secondary | ICD-10-CM | POA: Diagnosis not present

## 2018-12-12 DIAGNOSIS — R2681 Unsteadiness on feet: Secondary | ICD-10-CM | POA: Diagnosis not present

## 2018-12-12 DIAGNOSIS — M6281 Muscle weakness (generalized): Secondary | ICD-10-CM | POA: Diagnosis not present

## 2018-12-12 DIAGNOSIS — R2689 Other abnormalities of gait and mobility: Secondary | ICD-10-CM | POA: Diagnosis not present

## 2018-12-12 DIAGNOSIS — R278 Other lack of coordination: Secondary | ICD-10-CM | POA: Diagnosis not present

## 2018-12-12 DIAGNOSIS — Z9181 History of falling: Secondary | ICD-10-CM | POA: Diagnosis not present

## 2018-12-13 DIAGNOSIS — Z9181 History of falling: Secondary | ICD-10-CM | POA: Diagnosis not present

## 2018-12-13 DIAGNOSIS — R2681 Unsteadiness on feet: Secondary | ICD-10-CM | POA: Diagnosis not present

## 2018-12-13 DIAGNOSIS — R278 Other lack of coordination: Secondary | ICD-10-CM | POA: Diagnosis not present

## 2018-12-13 DIAGNOSIS — M6281 Muscle weakness (generalized): Secondary | ICD-10-CM | POA: Diagnosis not present

## 2018-12-13 DIAGNOSIS — R2689 Other abnormalities of gait and mobility: Secondary | ICD-10-CM | POA: Diagnosis not present

## 2018-12-14 DIAGNOSIS — R278 Other lack of coordination: Secondary | ICD-10-CM | POA: Diagnosis not present

## 2018-12-14 DIAGNOSIS — R2689 Other abnormalities of gait and mobility: Secondary | ICD-10-CM | POA: Diagnosis not present

## 2018-12-14 DIAGNOSIS — M6281 Muscle weakness (generalized): Secondary | ICD-10-CM | POA: Diagnosis not present

## 2018-12-14 DIAGNOSIS — R2681 Unsteadiness on feet: Secondary | ICD-10-CM | POA: Diagnosis not present

## 2018-12-14 DIAGNOSIS — Z9181 History of falling: Secondary | ICD-10-CM | POA: Diagnosis not present

## 2018-12-17 DIAGNOSIS — R278 Other lack of coordination: Secondary | ICD-10-CM | POA: Diagnosis not present

## 2018-12-17 DIAGNOSIS — Z9181 History of falling: Secondary | ICD-10-CM | POA: Diagnosis not present

## 2018-12-17 DIAGNOSIS — R2689 Other abnormalities of gait and mobility: Secondary | ICD-10-CM | POA: Diagnosis not present

## 2018-12-17 DIAGNOSIS — R2681 Unsteadiness on feet: Secondary | ICD-10-CM | POA: Diagnosis not present

## 2018-12-17 DIAGNOSIS — M6281 Muscle weakness (generalized): Secondary | ICD-10-CM | POA: Diagnosis not present

## 2018-12-18 DIAGNOSIS — R278 Other lack of coordination: Secondary | ICD-10-CM | POA: Diagnosis not present

## 2018-12-18 DIAGNOSIS — R2689 Other abnormalities of gait and mobility: Secondary | ICD-10-CM | POA: Diagnosis not present

## 2018-12-18 DIAGNOSIS — R2681 Unsteadiness on feet: Secondary | ICD-10-CM | POA: Diagnosis not present

## 2018-12-18 DIAGNOSIS — Z9181 History of falling: Secondary | ICD-10-CM | POA: Diagnosis not present

## 2018-12-18 DIAGNOSIS — M6281 Muscle weakness (generalized): Secondary | ICD-10-CM | POA: Diagnosis not present

## 2018-12-21 DIAGNOSIS — R2689 Other abnormalities of gait and mobility: Secondary | ICD-10-CM | POA: Diagnosis not present

## 2018-12-21 DIAGNOSIS — M6281 Muscle weakness (generalized): Secondary | ICD-10-CM | POA: Diagnosis not present

## 2018-12-21 DIAGNOSIS — R2681 Unsteadiness on feet: Secondary | ICD-10-CM | POA: Diagnosis not present

## 2018-12-21 DIAGNOSIS — Z9181 History of falling: Secondary | ICD-10-CM | POA: Diagnosis not present

## 2018-12-21 DIAGNOSIS — R278 Other lack of coordination: Secondary | ICD-10-CM | POA: Diagnosis not present

## 2018-12-23 DIAGNOSIS — R2689 Other abnormalities of gait and mobility: Secondary | ICD-10-CM | POA: Diagnosis not present

## 2018-12-23 DIAGNOSIS — R2681 Unsteadiness on feet: Secondary | ICD-10-CM | POA: Diagnosis not present

## 2018-12-23 DIAGNOSIS — M6281 Muscle weakness (generalized): Secondary | ICD-10-CM | POA: Diagnosis not present

## 2018-12-23 DIAGNOSIS — Z9181 History of falling: Secondary | ICD-10-CM | POA: Diagnosis not present

## 2018-12-23 DIAGNOSIS — R278 Other lack of coordination: Secondary | ICD-10-CM | POA: Diagnosis not present

## 2018-12-24 DIAGNOSIS — Z9181 History of falling: Secondary | ICD-10-CM | POA: Diagnosis not present

## 2018-12-24 DIAGNOSIS — R2681 Unsteadiness on feet: Secondary | ICD-10-CM | POA: Diagnosis not present

## 2018-12-24 DIAGNOSIS — R2689 Other abnormalities of gait and mobility: Secondary | ICD-10-CM | POA: Diagnosis not present

## 2018-12-24 DIAGNOSIS — R278 Other lack of coordination: Secondary | ICD-10-CM | POA: Diagnosis not present

## 2018-12-24 DIAGNOSIS — M6281 Muscle weakness (generalized): Secondary | ICD-10-CM | POA: Diagnosis not present

## 2018-12-25 DIAGNOSIS — Z9181 History of falling: Secondary | ICD-10-CM | POA: Diagnosis not present

## 2018-12-25 DIAGNOSIS — M6281 Muscle weakness (generalized): Secondary | ICD-10-CM | POA: Diagnosis not present

## 2018-12-25 DIAGNOSIS — R2689 Other abnormalities of gait and mobility: Secondary | ICD-10-CM | POA: Diagnosis not present

## 2018-12-25 DIAGNOSIS — R2681 Unsteadiness on feet: Secondary | ICD-10-CM | POA: Diagnosis not present

## 2018-12-25 DIAGNOSIS — R278 Other lack of coordination: Secondary | ICD-10-CM | POA: Diagnosis not present

## 2018-12-26 DIAGNOSIS — M6281 Muscle weakness (generalized): Secondary | ICD-10-CM | POA: Diagnosis not present

## 2018-12-26 DIAGNOSIS — R2689 Other abnormalities of gait and mobility: Secondary | ICD-10-CM | POA: Diagnosis not present

## 2018-12-26 DIAGNOSIS — Z9181 History of falling: Secondary | ICD-10-CM | POA: Diagnosis not present

## 2018-12-26 DIAGNOSIS — R2681 Unsteadiness on feet: Secondary | ICD-10-CM | POA: Diagnosis not present

## 2018-12-26 DIAGNOSIS — R278 Other lack of coordination: Secondary | ICD-10-CM | POA: Diagnosis not present

## 2018-12-27 DIAGNOSIS — R2689 Other abnormalities of gait and mobility: Secondary | ICD-10-CM | POA: Diagnosis not present

## 2018-12-27 DIAGNOSIS — M6281 Muscle weakness (generalized): Secondary | ICD-10-CM | POA: Diagnosis not present

## 2018-12-27 DIAGNOSIS — Z9181 History of falling: Secondary | ICD-10-CM | POA: Diagnosis not present

## 2018-12-27 DIAGNOSIS — R278 Other lack of coordination: Secondary | ICD-10-CM | POA: Diagnosis not present

## 2018-12-27 DIAGNOSIS — R2681 Unsteadiness on feet: Secondary | ICD-10-CM | POA: Diagnosis not present

## 2018-12-28 DIAGNOSIS — R278 Other lack of coordination: Secondary | ICD-10-CM | POA: Diagnosis not present

## 2018-12-28 DIAGNOSIS — R2681 Unsteadiness on feet: Secondary | ICD-10-CM | POA: Diagnosis not present

## 2018-12-28 DIAGNOSIS — Z9181 History of falling: Secondary | ICD-10-CM | POA: Diagnosis not present

## 2018-12-28 DIAGNOSIS — M6281 Muscle weakness (generalized): Secondary | ICD-10-CM | POA: Diagnosis not present

## 2018-12-28 DIAGNOSIS — R2689 Other abnormalities of gait and mobility: Secondary | ICD-10-CM | POA: Diagnosis not present

## 2018-12-31 DIAGNOSIS — R2689 Other abnormalities of gait and mobility: Secondary | ICD-10-CM | POA: Diagnosis not present

## 2018-12-31 DIAGNOSIS — R278 Other lack of coordination: Secondary | ICD-10-CM | POA: Diagnosis not present

## 2018-12-31 DIAGNOSIS — M6281 Muscle weakness (generalized): Secondary | ICD-10-CM | POA: Diagnosis not present

## 2018-12-31 DIAGNOSIS — R2681 Unsteadiness on feet: Secondary | ICD-10-CM | POA: Diagnosis not present

## 2018-12-31 DIAGNOSIS — Z9181 History of falling: Secondary | ICD-10-CM | POA: Diagnosis not present

## 2019-01-01 DIAGNOSIS — Z9181 History of falling: Secondary | ICD-10-CM | POA: Diagnosis not present

## 2019-01-01 DIAGNOSIS — R2689 Other abnormalities of gait and mobility: Secondary | ICD-10-CM | POA: Diagnosis not present

## 2019-01-01 DIAGNOSIS — M6281 Muscle weakness (generalized): Secondary | ICD-10-CM | POA: Diagnosis not present

## 2019-01-01 DIAGNOSIS — R278 Other lack of coordination: Secondary | ICD-10-CM | POA: Diagnosis not present

## 2019-01-01 DIAGNOSIS — R2681 Unsteadiness on feet: Secondary | ICD-10-CM | POA: Diagnosis not present

## 2019-01-02 DIAGNOSIS — Z9181 History of falling: Secondary | ICD-10-CM | POA: Diagnosis not present

## 2019-01-02 DIAGNOSIS — R278 Other lack of coordination: Secondary | ICD-10-CM | POA: Diagnosis not present

## 2019-01-02 DIAGNOSIS — M6281 Muscle weakness (generalized): Secondary | ICD-10-CM | POA: Diagnosis not present

## 2019-01-02 DIAGNOSIS — R2689 Other abnormalities of gait and mobility: Secondary | ICD-10-CM | POA: Diagnosis not present

## 2019-01-02 DIAGNOSIS — R2681 Unsteadiness on feet: Secondary | ICD-10-CM | POA: Diagnosis not present

## 2019-01-03 DIAGNOSIS — R278 Other lack of coordination: Secondary | ICD-10-CM | POA: Diagnosis not present

## 2019-01-03 DIAGNOSIS — Z9181 History of falling: Secondary | ICD-10-CM | POA: Diagnosis not present

## 2019-01-03 DIAGNOSIS — R2681 Unsteadiness on feet: Secondary | ICD-10-CM | POA: Diagnosis not present

## 2019-01-03 DIAGNOSIS — R2689 Other abnormalities of gait and mobility: Secondary | ICD-10-CM | POA: Diagnosis not present

## 2019-01-03 DIAGNOSIS — M6281 Muscle weakness (generalized): Secondary | ICD-10-CM | POA: Diagnosis not present

## 2019-01-04 DIAGNOSIS — R278 Other lack of coordination: Secondary | ICD-10-CM | POA: Diagnosis not present

## 2019-01-04 DIAGNOSIS — Z9181 History of falling: Secondary | ICD-10-CM | POA: Diagnosis not present

## 2019-01-04 DIAGNOSIS — M6281 Muscle weakness (generalized): Secondary | ICD-10-CM | POA: Diagnosis not present

## 2019-01-04 DIAGNOSIS — R2681 Unsteadiness on feet: Secondary | ICD-10-CM | POA: Diagnosis not present

## 2019-01-04 DIAGNOSIS — R2689 Other abnormalities of gait and mobility: Secondary | ICD-10-CM | POA: Diagnosis not present

## 2019-01-07 DIAGNOSIS — M6281 Muscle weakness (generalized): Secondary | ICD-10-CM | POA: Diagnosis not present

## 2019-01-07 DIAGNOSIS — R41841 Cognitive communication deficit: Secondary | ICD-10-CM | POA: Diagnosis not present

## 2019-01-07 DIAGNOSIS — Z9181 History of falling: Secondary | ICD-10-CM | POA: Diagnosis not present

## 2019-01-07 DIAGNOSIS — R278 Other lack of coordination: Secondary | ICD-10-CM | POA: Diagnosis not present

## 2019-01-07 DIAGNOSIS — R2681 Unsteadiness on feet: Secondary | ICD-10-CM | POA: Diagnosis not present

## 2019-01-07 DIAGNOSIS — R2689 Other abnormalities of gait and mobility: Secondary | ICD-10-CM | POA: Diagnosis not present

## 2019-01-08 DIAGNOSIS — Z9181 History of falling: Secondary | ICD-10-CM | POA: Diagnosis not present

## 2019-01-08 DIAGNOSIS — R41841 Cognitive communication deficit: Secondary | ICD-10-CM | POA: Diagnosis not present

## 2019-01-08 DIAGNOSIS — R278 Other lack of coordination: Secondary | ICD-10-CM | POA: Diagnosis not present

## 2019-01-08 DIAGNOSIS — R2689 Other abnormalities of gait and mobility: Secondary | ICD-10-CM | POA: Diagnosis not present

## 2019-01-08 DIAGNOSIS — R2681 Unsteadiness on feet: Secondary | ICD-10-CM | POA: Diagnosis not present

## 2019-01-08 DIAGNOSIS — M6281 Muscle weakness (generalized): Secondary | ICD-10-CM | POA: Diagnosis not present

## 2019-01-09 DIAGNOSIS — R41841 Cognitive communication deficit: Secondary | ICD-10-CM | POA: Diagnosis not present

## 2019-01-09 DIAGNOSIS — R2689 Other abnormalities of gait and mobility: Secondary | ICD-10-CM | POA: Diagnosis not present

## 2019-01-09 DIAGNOSIS — M6281 Muscle weakness (generalized): Secondary | ICD-10-CM | POA: Diagnosis not present

## 2019-01-09 DIAGNOSIS — Z9181 History of falling: Secondary | ICD-10-CM | POA: Diagnosis not present

## 2019-01-09 DIAGNOSIS — R2681 Unsteadiness on feet: Secondary | ICD-10-CM | POA: Diagnosis not present

## 2019-01-09 DIAGNOSIS — R278 Other lack of coordination: Secondary | ICD-10-CM | POA: Diagnosis not present

## 2019-01-10 DIAGNOSIS — R2681 Unsteadiness on feet: Secondary | ICD-10-CM | POA: Diagnosis not present

## 2019-01-10 DIAGNOSIS — Z9181 History of falling: Secondary | ICD-10-CM | POA: Diagnosis not present

## 2019-01-10 DIAGNOSIS — M6281 Muscle weakness (generalized): Secondary | ICD-10-CM | POA: Diagnosis not present

## 2019-01-10 DIAGNOSIS — R278 Other lack of coordination: Secondary | ICD-10-CM | POA: Diagnosis not present

## 2019-01-10 DIAGNOSIS — R2689 Other abnormalities of gait and mobility: Secondary | ICD-10-CM | POA: Diagnosis not present

## 2019-01-10 DIAGNOSIS — R41841 Cognitive communication deficit: Secondary | ICD-10-CM | POA: Diagnosis not present

## 2019-01-11 DIAGNOSIS — M6281 Muscle weakness (generalized): Secondary | ICD-10-CM | POA: Diagnosis not present

## 2019-01-11 DIAGNOSIS — R2689 Other abnormalities of gait and mobility: Secondary | ICD-10-CM | POA: Diagnosis not present

## 2019-01-11 DIAGNOSIS — R278 Other lack of coordination: Secondary | ICD-10-CM | POA: Diagnosis not present

## 2019-01-11 DIAGNOSIS — Z9181 History of falling: Secondary | ICD-10-CM | POA: Diagnosis not present

## 2019-01-11 DIAGNOSIS — R2681 Unsteadiness on feet: Secondary | ICD-10-CM | POA: Diagnosis not present

## 2019-01-11 DIAGNOSIS — R41841 Cognitive communication deficit: Secondary | ICD-10-CM | POA: Diagnosis not present

## 2019-01-14 DIAGNOSIS — R41841 Cognitive communication deficit: Secondary | ICD-10-CM | POA: Diagnosis not present

## 2019-01-14 DIAGNOSIS — R2689 Other abnormalities of gait and mobility: Secondary | ICD-10-CM | POA: Diagnosis not present

## 2019-01-14 DIAGNOSIS — R278 Other lack of coordination: Secondary | ICD-10-CM | POA: Diagnosis not present

## 2019-01-14 DIAGNOSIS — Z9181 History of falling: Secondary | ICD-10-CM | POA: Diagnosis not present

## 2019-01-14 DIAGNOSIS — M6281 Muscle weakness (generalized): Secondary | ICD-10-CM | POA: Diagnosis not present

## 2019-01-14 DIAGNOSIS — R2681 Unsteadiness on feet: Secondary | ICD-10-CM | POA: Diagnosis not present

## 2019-01-15 DIAGNOSIS — Z9181 History of falling: Secondary | ICD-10-CM | POA: Diagnosis not present

## 2019-01-15 DIAGNOSIS — R41841 Cognitive communication deficit: Secondary | ICD-10-CM | POA: Diagnosis not present

## 2019-01-15 DIAGNOSIS — R2689 Other abnormalities of gait and mobility: Secondary | ICD-10-CM | POA: Diagnosis not present

## 2019-01-15 DIAGNOSIS — M6281 Muscle weakness (generalized): Secondary | ICD-10-CM | POA: Diagnosis not present

## 2019-01-15 DIAGNOSIS — R2681 Unsteadiness on feet: Secondary | ICD-10-CM | POA: Diagnosis not present

## 2019-01-15 DIAGNOSIS — R278 Other lack of coordination: Secondary | ICD-10-CM | POA: Diagnosis not present

## 2019-01-16 DIAGNOSIS — R2681 Unsteadiness on feet: Secondary | ICD-10-CM | POA: Diagnosis not present

## 2019-01-16 DIAGNOSIS — M6281 Muscle weakness (generalized): Secondary | ICD-10-CM | POA: Diagnosis not present

## 2019-01-16 DIAGNOSIS — Z9181 History of falling: Secondary | ICD-10-CM | POA: Diagnosis not present

## 2019-01-16 DIAGNOSIS — R2689 Other abnormalities of gait and mobility: Secondary | ICD-10-CM | POA: Diagnosis not present

## 2019-01-16 DIAGNOSIS — R278 Other lack of coordination: Secondary | ICD-10-CM | POA: Diagnosis not present

## 2019-01-16 DIAGNOSIS — R41841 Cognitive communication deficit: Secondary | ICD-10-CM | POA: Diagnosis not present

## 2019-01-18 DIAGNOSIS — R41841 Cognitive communication deficit: Secondary | ICD-10-CM | POA: Diagnosis not present

## 2019-01-18 DIAGNOSIS — R2689 Other abnormalities of gait and mobility: Secondary | ICD-10-CM | POA: Diagnosis not present

## 2019-01-18 DIAGNOSIS — M6281 Muscle weakness (generalized): Secondary | ICD-10-CM | POA: Diagnosis not present

## 2019-01-18 DIAGNOSIS — R2681 Unsteadiness on feet: Secondary | ICD-10-CM | POA: Diagnosis not present

## 2019-01-18 DIAGNOSIS — R278 Other lack of coordination: Secondary | ICD-10-CM | POA: Diagnosis not present

## 2019-01-18 DIAGNOSIS — Z9181 History of falling: Secondary | ICD-10-CM | POA: Diagnosis not present

## 2019-01-21 DIAGNOSIS — R278 Other lack of coordination: Secondary | ICD-10-CM | POA: Diagnosis not present

## 2019-01-21 DIAGNOSIS — R2689 Other abnormalities of gait and mobility: Secondary | ICD-10-CM | POA: Diagnosis not present

## 2019-01-21 DIAGNOSIS — R41841 Cognitive communication deficit: Secondary | ICD-10-CM | POA: Diagnosis not present

## 2019-01-21 DIAGNOSIS — Z9181 History of falling: Secondary | ICD-10-CM | POA: Diagnosis not present

## 2019-01-21 DIAGNOSIS — M6281 Muscle weakness (generalized): Secondary | ICD-10-CM | POA: Diagnosis not present

## 2019-01-21 DIAGNOSIS — R2681 Unsteadiness on feet: Secondary | ICD-10-CM | POA: Diagnosis not present

## 2019-01-22 DIAGNOSIS — R2681 Unsteadiness on feet: Secondary | ICD-10-CM | POA: Diagnosis not present

## 2019-01-22 DIAGNOSIS — Z9181 History of falling: Secondary | ICD-10-CM | POA: Diagnosis not present

## 2019-01-22 DIAGNOSIS — R2689 Other abnormalities of gait and mobility: Secondary | ICD-10-CM | POA: Diagnosis not present

## 2019-01-22 DIAGNOSIS — R41841 Cognitive communication deficit: Secondary | ICD-10-CM | POA: Diagnosis not present

## 2019-01-22 DIAGNOSIS — M6281 Muscle weakness (generalized): Secondary | ICD-10-CM | POA: Diagnosis not present

## 2019-01-22 DIAGNOSIS — R278 Other lack of coordination: Secondary | ICD-10-CM | POA: Diagnosis not present

## 2019-01-23 DIAGNOSIS — R41841 Cognitive communication deficit: Secondary | ICD-10-CM | POA: Diagnosis not present

## 2019-01-23 DIAGNOSIS — Z9181 History of falling: Secondary | ICD-10-CM | POA: Diagnosis not present

## 2019-01-23 DIAGNOSIS — M6281 Muscle weakness (generalized): Secondary | ICD-10-CM | POA: Diagnosis not present

## 2019-01-23 DIAGNOSIS — R2689 Other abnormalities of gait and mobility: Secondary | ICD-10-CM | POA: Diagnosis not present

## 2019-01-23 DIAGNOSIS — R2681 Unsteadiness on feet: Secondary | ICD-10-CM | POA: Diagnosis not present

## 2019-01-23 DIAGNOSIS — R278 Other lack of coordination: Secondary | ICD-10-CM | POA: Diagnosis not present

## 2019-01-24 DIAGNOSIS — R2681 Unsteadiness on feet: Secondary | ICD-10-CM | POA: Diagnosis not present

## 2019-01-24 DIAGNOSIS — R278 Other lack of coordination: Secondary | ICD-10-CM | POA: Diagnosis not present

## 2019-01-24 DIAGNOSIS — M6281 Muscle weakness (generalized): Secondary | ICD-10-CM | POA: Diagnosis not present

## 2019-01-24 DIAGNOSIS — R41841 Cognitive communication deficit: Secondary | ICD-10-CM | POA: Diagnosis not present

## 2019-01-24 DIAGNOSIS — R2689 Other abnormalities of gait and mobility: Secondary | ICD-10-CM | POA: Diagnosis not present

## 2019-01-24 DIAGNOSIS — Z9181 History of falling: Secondary | ICD-10-CM | POA: Diagnosis not present

## 2019-01-25 DIAGNOSIS — R2689 Other abnormalities of gait and mobility: Secondary | ICD-10-CM | POA: Diagnosis not present

## 2019-01-25 DIAGNOSIS — M6281 Muscle weakness (generalized): Secondary | ICD-10-CM | POA: Diagnosis not present

## 2019-01-25 DIAGNOSIS — Z9181 History of falling: Secondary | ICD-10-CM | POA: Diagnosis not present

## 2019-01-25 DIAGNOSIS — R2681 Unsteadiness on feet: Secondary | ICD-10-CM | POA: Diagnosis not present

## 2019-01-25 DIAGNOSIS — R41841 Cognitive communication deficit: Secondary | ICD-10-CM | POA: Diagnosis not present

## 2019-01-25 DIAGNOSIS — R278 Other lack of coordination: Secondary | ICD-10-CM | POA: Diagnosis not present

## 2019-01-28 DIAGNOSIS — R41841 Cognitive communication deficit: Secondary | ICD-10-CM | POA: Diagnosis not present

## 2019-01-28 DIAGNOSIS — R278 Other lack of coordination: Secondary | ICD-10-CM | POA: Diagnosis not present

## 2019-01-28 DIAGNOSIS — R2689 Other abnormalities of gait and mobility: Secondary | ICD-10-CM | POA: Diagnosis not present

## 2019-01-28 DIAGNOSIS — M6281 Muscle weakness (generalized): Secondary | ICD-10-CM | POA: Diagnosis not present

## 2019-01-28 DIAGNOSIS — R2681 Unsteadiness on feet: Secondary | ICD-10-CM | POA: Diagnosis not present

## 2019-01-28 DIAGNOSIS — Z9181 History of falling: Secondary | ICD-10-CM | POA: Diagnosis not present

## 2019-01-29 DIAGNOSIS — R2681 Unsteadiness on feet: Secondary | ICD-10-CM | POA: Diagnosis not present

## 2019-01-29 DIAGNOSIS — M6281 Muscle weakness (generalized): Secondary | ICD-10-CM | POA: Diagnosis not present

## 2019-01-29 DIAGNOSIS — R278 Other lack of coordination: Secondary | ICD-10-CM | POA: Diagnosis not present

## 2019-01-29 DIAGNOSIS — R2689 Other abnormalities of gait and mobility: Secondary | ICD-10-CM | POA: Diagnosis not present

## 2019-01-29 DIAGNOSIS — R41841 Cognitive communication deficit: Secondary | ICD-10-CM | POA: Diagnosis not present

## 2019-01-29 DIAGNOSIS — Z9181 History of falling: Secondary | ICD-10-CM | POA: Diagnosis not present

## 2019-01-30 DIAGNOSIS — R41841 Cognitive communication deficit: Secondary | ICD-10-CM | POA: Diagnosis not present

## 2019-01-30 DIAGNOSIS — R2681 Unsteadiness on feet: Secondary | ICD-10-CM | POA: Diagnosis not present

## 2019-01-30 DIAGNOSIS — R2689 Other abnormalities of gait and mobility: Secondary | ICD-10-CM | POA: Diagnosis not present

## 2019-01-30 DIAGNOSIS — R278 Other lack of coordination: Secondary | ICD-10-CM | POA: Diagnosis not present

## 2019-01-30 DIAGNOSIS — Z9181 History of falling: Secondary | ICD-10-CM | POA: Diagnosis not present

## 2019-01-30 DIAGNOSIS — M6281 Muscle weakness (generalized): Secondary | ICD-10-CM | POA: Diagnosis not present

## 2019-01-31 DIAGNOSIS — R41841 Cognitive communication deficit: Secondary | ICD-10-CM | POA: Diagnosis not present

## 2019-01-31 DIAGNOSIS — M6281 Muscle weakness (generalized): Secondary | ICD-10-CM | POA: Diagnosis not present

## 2019-01-31 DIAGNOSIS — Z9181 History of falling: Secondary | ICD-10-CM | POA: Diagnosis not present

## 2019-01-31 DIAGNOSIS — R2689 Other abnormalities of gait and mobility: Secondary | ICD-10-CM | POA: Diagnosis not present

## 2019-01-31 DIAGNOSIS — R2681 Unsteadiness on feet: Secondary | ICD-10-CM | POA: Diagnosis not present

## 2019-01-31 DIAGNOSIS — R278 Other lack of coordination: Secondary | ICD-10-CM | POA: Diagnosis not present

## 2019-02-04 DIAGNOSIS — R2681 Unsteadiness on feet: Secondary | ICD-10-CM | POA: Diagnosis not present

## 2019-02-04 DIAGNOSIS — Z9181 History of falling: Secondary | ICD-10-CM | POA: Diagnosis not present

## 2019-02-04 DIAGNOSIS — M6281 Muscle weakness (generalized): Secondary | ICD-10-CM | POA: Diagnosis not present

## 2019-02-04 DIAGNOSIS — R278 Other lack of coordination: Secondary | ICD-10-CM | POA: Diagnosis not present

## 2019-02-04 DIAGNOSIS — R2689 Other abnormalities of gait and mobility: Secondary | ICD-10-CM | POA: Diagnosis not present

## 2019-02-04 DIAGNOSIS — R41841 Cognitive communication deficit: Secondary | ICD-10-CM | POA: Diagnosis not present

## 2019-02-05 DIAGNOSIS — R41841 Cognitive communication deficit: Secondary | ICD-10-CM | POA: Diagnosis not present

## 2019-02-05 DIAGNOSIS — R2689 Other abnormalities of gait and mobility: Secondary | ICD-10-CM | POA: Diagnosis not present

## 2019-02-05 DIAGNOSIS — R278 Other lack of coordination: Secondary | ICD-10-CM | POA: Diagnosis not present

## 2019-02-05 DIAGNOSIS — Z9181 History of falling: Secondary | ICD-10-CM | POA: Diagnosis not present

## 2019-02-05 DIAGNOSIS — R2681 Unsteadiness on feet: Secondary | ICD-10-CM | POA: Diagnosis not present

## 2019-02-05 DIAGNOSIS — M6281 Muscle weakness (generalized): Secondary | ICD-10-CM | POA: Diagnosis not present

## 2019-02-06 DIAGNOSIS — Z9181 History of falling: Secondary | ICD-10-CM | POA: Diagnosis not present

## 2019-02-06 DIAGNOSIS — M6281 Muscle weakness (generalized): Secondary | ICD-10-CM | POA: Diagnosis not present

## 2019-02-06 DIAGNOSIS — R2681 Unsteadiness on feet: Secondary | ICD-10-CM | POA: Diagnosis not present

## 2019-02-06 DIAGNOSIS — R278 Other lack of coordination: Secondary | ICD-10-CM | POA: Diagnosis not present

## 2019-02-06 DIAGNOSIS — R2689 Other abnormalities of gait and mobility: Secondary | ICD-10-CM | POA: Diagnosis not present

## 2019-02-06 DIAGNOSIS — R41841 Cognitive communication deficit: Secondary | ICD-10-CM | POA: Diagnosis not present

## 2019-02-07 DIAGNOSIS — Z9181 History of falling: Secondary | ICD-10-CM | POA: Diagnosis not present

## 2019-02-07 DIAGNOSIS — R2681 Unsteadiness on feet: Secondary | ICD-10-CM | POA: Diagnosis not present

## 2019-02-07 DIAGNOSIS — R41841 Cognitive communication deficit: Secondary | ICD-10-CM | POA: Diagnosis not present

## 2019-02-07 DIAGNOSIS — M6281 Muscle weakness (generalized): Secondary | ICD-10-CM | POA: Diagnosis not present

## 2019-02-07 DIAGNOSIS — R2689 Other abnormalities of gait and mobility: Secondary | ICD-10-CM | POA: Diagnosis not present

## 2019-02-07 DIAGNOSIS — R278 Other lack of coordination: Secondary | ICD-10-CM | POA: Diagnosis not present

## 2019-02-08 DIAGNOSIS — M6281 Muscle weakness (generalized): Secondary | ICD-10-CM | POA: Diagnosis not present

## 2019-02-08 DIAGNOSIS — R41841 Cognitive communication deficit: Secondary | ICD-10-CM | POA: Diagnosis not present

## 2019-02-08 DIAGNOSIS — R278 Other lack of coordination: Secondary | ICD-10-CM | POA: Diagnosis not present

## 2019-02-08 DIAGNOSIS — Z9181 History of falling: Secondary | ICD-10-CM | POA: Diagnosis not present

## 2019-02-08 DIAGNOSIS — R2681 Unsteadiness on feet: Secondary | ICD-10-CM | POA: Diagnosis not present

## 2019-02-08 DIAGNOSIS — R2689 Other abnormalities of gait and mobility: Secondary | ICD-10-CM | POA: Diagnosis not present

## 2019-02-11 DIAGNOSIS — M6281 Muscle weakness (generalized): Secondary | ICD-10-CM | POA: Diagnosis not present

## 2019-02-11 DIAGNOSIS — R41841 Cognitive communication deficit: Secondary | ICD-10-CM | POA: Diagnosis not present

## 2019-02-11 DIAGNOSIS — R2689 Other abnormalities of gait and mobility: Secondary | ICD-10-CM | POA: Diagnosis not present

## 2019-02-11 DIAGNOSIS — R278 Other lack of coordination: Secondary | ICD-10-CM | POA: Diagnosis not present

## 2019-02-11 DIAGNOSIS — Z9181 History of falling: Secondary | ICD-10-CM | POA: Diagnosis not present

## 2019-02-11 DIAGNOSIS — R2681 Unsteadiness on feet: Secondary | ICD-10-CM | POA: Diagnosis not present

## 2019-02-12 DIAGNOSIS — Z9181 History of falling: Secondary | ICD-10-CM | POA: Diagnosis not present

## 2019-02-12 DIAGNOSIS — R2681 Unsteadiness on feet: Secondary | ICD-10-CM | POA: Diagnosis not present

## 2019-02-12 DIAGNOSIS — R278 Other lack of coordination: Secondary | ICD-10-CM | POA: Diagnosis not present

## 2019-02-12 DIAGNOSIS — M6281 Muscle weakness (generalized): Secondary | ICD-10-CM | POA: Diagnosis not present

## 2019-02-12 DIAGNOSIS — R41841 Cognitive communication deficit: Secondary | ICD-10-CM | POA: Diagnosis not present

## 2019-02-12 DIAGNOSIS — R2689 Other abnormalities of gait and mobility: Secondary | ICD-10-CM | POA: Diagnosis not present

## 2019-02-13 DIAGNOSIS — M6281 Muscle weakness (generalized): Secondary | ICD-10-CM | POA: Diagnosis not present

## 2019-02-13 DIAGNOSIS — R278 Other lack of coordination: Secondary | ICD-10-CM | POA: Diagnosis not present

## 2019-02-13 DIAGNOSIS — R2689 Other abnormalities of gait and mobility: Secondary | ICD-10-CM | POA: Diagnosis not present

## 2019-02-13 DIAGNOSIS — Z9181 History of falling: Secondary | ICD-10-CM | POA: Diagnosis not present

## 2019-02-13 DIAGNOSIS — R2681 Unsteadiness on feet: Secondary | ICD-10-CM | POA: Diagnosis not present

## 2019-02-13 DIAGNOSIS — R41841 Cognitive communication deficit: Secondary | ICD-10-CM | POA: Diagnosis not present

## 2019-02-14 DIAGNOSIS — R2689 Other abnormalities of gait and mobility: Secondary | ICD-10-CM | POA: Diagnosis not present

## 2019-02-14 DIAGNOSIS — R2681 Unsteadiness on feet: Secondary | ICD-10-CM | POA: Diagnosis not present

## 2019-02-14 DIAGNOSIS — R41841 Cognitive communication deficit: Secondary | ICD-10-CM | POA: Diagnosis not present

## 2019-02-14 DIAGNOSIS — Z9181 History of falling: Secondary | ICD-10-CM | POA: Diagnosis not present

## 2019-02-14 DIAGNOSIS — R278 Other lack of coordination: Secondary | ICD-10-CM | POA: Diagnosis not present

## 2019-02-14 DIAGNOSIS — M6281 Muscle weakness (generalized): Secondary | ICD-10-CM | POA: Diagnosis not present

## 2019-02-15 DIAGNOSIS — M6281 Muscle weakness (generalized): Secondary | ICD-10-CM | POA: Diagnosis not present

## 2019-02-15 DIAGNOSIS — R278 Other lack of coordination: Secondary | ICD-10-CM | POA: Diagnosis not present

## 2019-02-15 DIAGNOSIS — Z9181 History of falling: Secondary | ICD-10-CM | POA: Diagnosis not present

## 2019-02-15 DIAGNOSIS — R2681 Unsteadiness on feet: Secondary | ICD-10-CM | POA: Diagnosis not present

## 2019-02-15 DIAGNOSIS — R41841 Cognitive communication deficit: Secondary | ICD-10-CM | POA: Diagnosis not present

## 2019-02-15 DIAGNOSIS — R2689 Other abnormalities of gait and mobility: Secondary | ICD-10-CM | POA: Diagnosis not present

## 2019-02-18 DIAGNOSIS — M6281 Muscle weakness (generalized): Secondary | ICD-10-CM | POA: Diagnosis not present

## 2019-02-18 DIAGNOSIS — R2681 Unsteadiness on feet: Secondary | ICD-10-CM | POA: Diagnosis not present

## 2019-02-18 DIAGNOSIS — R41841 Cognitive communication deficit: Secondary | ICD-10-CM | POA: Diagnosis not present

## 2019-02-18 DIAGNOSIS — R278 Other lack of coordination: Secondary | ICD-10-CM | POA: Diagnosis not present

## 2019-02-18 DIAGNOSIS — Z9181 History of falling: Secondary | ICD-10-CM | POA: Diagnosis not present

## 2019-02-18 DIAGNOSIS — R2689 Other abnormalities of gait and mobility: Secondary | ICD-10-CM | POA: Diagnosis not present

## 2019-02-19 DIAGNOSIS — R2689 Other abnormalities of gait and mobility: Secondary | ICD-10-CM | POA: Diagnosis not present

## 2019-02-19 DIAGNOSIS — Z9181 History of falling: Secondary | ICD-10-CM | POA: Diagnosis not present

## 2019-02-19 DIAGNOSIS — M6281 Muscle weakness (generalized): Secondary | ICD-10-CM | POA: Diagnosis not present

## 2019-02-19 DIAGNOSIS — R2681 Unsteadiness on feet: Secondary | ICD-10-CM | POA: Diagnosis not present

## 2019-02-19 DIAGNOSIS — R278 Other lack of coordination: Secondary | ICD-10-CM | POA: Diagnosis not present

## 2019-02-19 DIAGNOSIS — R41841 Cognitive communication deficit: Secondary | ICD-10-CM | POA: Diagnosis not present

## 2019-02-20 DIAGNOSIS — R278 Other lack of coordination: Secondary | ICD-10-CM | POA: Diagnosis not present

## 2019-02-20 DIAGNOSIS — R2681 Unsteadiness on feet: Secondary | ICD-10-CM | POA: Diagnosis not present

## 2019-02-20 DIAGNOSIS — M6281 Muscle weakness (generalized): Secondary | ICD-10-CM | POA: Diagnosis not present

## 2019-02-20 DIAGNOSIS — R41841 Cognitive communication deficit: Secondary | ICD-10-CM | POA: Diagnosis not present

## 2019-02-20 DIAGNOSIS — Z9181 History of falling: Secondary | ICD-10-CM | POA: Diagnosis not present

## 2019-02-20 DIAGNOSIS — R2689 Other abnormalities of gait and mobility: Secondary | ICD-10-CM | POA: Diagnosis not present

## 2019-02-21 DIAGNOSIS — Z9181 History of falling: Secondary | ICD-10-CM | POA: Diagnosis not present

## 2019-02-21 DIAGNOSIS — R41841 Cognitive communication deficit: Secondary | ICD-10-CM | POA: Diagnosis not present

## 2019-02-21 DIAGNOSIS — R2689 Other abnormalities of gait and mobility: Secondary | ICD-10-CM | POA: Diagnosis not present

## 2019-02-21 DIAGNOSIS — M6281 Muscle weakness (generalized): Secondary | ICD-10-CM | POA: Diagnosis not present

## 2019-02-21 DIAGNOSIS — R2681 Unsteadiness on feet: Secondary | ICD-10-CM | POA: Diagnosis not present

## 2019-02-21 DIAGNOSIS — R278 Other lack of coordination: Secondary | ICD-10-CM | POA: Diagnosis not present

## 2019-02-22 DIAGNOSIS — M6281 Muscle weakness (generalized): Secondary | ICD-10-CM | POA: Diagnosis not present

## 2019-02-22 DIAGNOSIS — R2681 Unsteadiness on feet: Secondary | ICD-10-CM | POA: Diagnosis not present

## 2019-02-22 DIAGNOSIS — R278 Other lack of coordination: Secondary | ICD-10-CM | POA: Diagnosis not present

## 2019-02-22 DIAGNOSIS — Z9181 History of falling: Secondary | ICD-10-CM | POA: Diagnosis not present

## 2019-02-22 DIAGNOSIS — R41841 Cognitive communication deficit: Secondary | ICD-10-CM | POA: Diagnosis not present

## 2019-02-22 DIAGNOSIS — R2689 Other abnormalities of gait and mobility: Secondary | ICD-10-CM | POA: Diagnosis not present

## 2019-02-24 DIAGNOSIS — M6281 Muscle weakness (generalized): Secondary | ICD-10-CM | POA: Diagnosis not present

## 2019-02-24 DIAGNOSIS — R278 Other lack of coordination: Secondary | ICD-10-CM | POA: Diagnosis not present

## 2019-02-24 DIAGNOSIS — R2689 Other abnormalities of gait and mobility: Secondary | ICD-10-CM | POA: Diagnosis not present

## 2019-02-24 DIAGNOSIS — Z9181 History of falling: Secondary | ICD-10-CM | POA: Diagnosis not present

## 2019-02-24 DIAGNOSIS — R2681 Unsteadiness on feet: Secondary | ICD-10-CM | POA: Diagnosis not present

## 2019-02-24 DIAGNOSIS — R41841 Cognitive communication deficit: Secondary | ICD-10-CM | POA: Diagnosis not present

## 2019-02-26 DIAGNOSIS — R41841 Cognitive communication deficit: Secondary | ICD-10-CM | POA: Diagnosis not present

## 2019-02-26 DIAGNOSIS — R2689 Other abnormalities of gait and mobility: Secondary | ICD-10-CM | POA: Diagnosis not present

## 2019-02-26 DIAGNOSIS — M6281 Muscle weakness (generalized): Secondary | ICD-10-CM | POA: Diagnosis not present

## 2019-02-26 DIAGNOSIS — R2681 Unsteadiness on feet: Secondary | ICD-10-CM | POA: Diagnosis not present

## 2019-02-26 DIAGNOSIS — Z9181 History of falling: Secondary | ICD-10-CM | POA: Diagnosis not present

## 2019-02-26 DIAGNOSIS — R278 Other lack of coordination: Secondary | ICD-10-CM | POA: Diagnosis not present

## 2019-02-27 DIAGNOSIS — R2689 Other abnormalities of gait and mobility: Secondary | ICD-10-CM | POA: Diagnosis not present

## 2019-02-27 DIAGNOSIS — R2681 Unsteadiness on feet: Secondary | ICD-10-CM | POA: Diagnosis not present

## 2019-02-27 DIAGNOSIS — Z9181 History of falling: Secondary | ICD-10-CM | POA: Diagnosis not present

## 2019-02-27 DIAGNOSIS — R278 Other lack of coordination: Secondary | ICD-10-CM | POA: Diagnosis not present

## 2019-02-27 DIAGNOSIS — M6281 Muscle weakness (generalized): Secondary | ICD-10-CM | POA: Diagnosis not present

## 2019-02-27 DIAGNOSIS — R41841 Cognitive communication deficit: Secondary | ICD-10-CM | POA: Diagnosis not present

## 2019-02-28 DIAGNOSIS — R41841 Cognitive communication deficit: Secondary | ICD-10-CM | POA: Diagnosis not present

## 2019-02-28 DIAGNOSIS — R2681 Unsteadiness on feet: Secondary | ICD-10-CM | POA: Diagnosis not present

## 2019-02-28 DIAGNOSIS — R278 Other lack of coordination: Secondary | ICD-10-CM | POA: Diagnosis not present

## 2019-02-28 DIAGNOSIS — M6281 Muscle weakness (generalized): Secondary | ICD-10-CM | POA: Diagnosis not present

## 2019-02-28 DIAGNOSIS — Z9181 History of falling: Secondary | ICD-10-CM | POA: Diagnosis not present

## 2019-02-28 DIAGNOSIS — R2689 Other abnormalities of gait and mobility: Secondary | ICD-10-CM | POA: Diagnosis not present

## 2019-03-01 DIAGNOSIS — R2689 Other abnormalities of gait and mobility: Secondary | ICD-10-CM | POA: Diagnosis not present

## 2019-03-01 DIAGNOSIS — R278 Other lack of coordination: Secondary | ICD-10-CM | POA: Diagnosis not present

## 2019-03-01 DIAGNOSIS — M6281 Muscle weakness (generalized): Secondary | ICD-10-CM | POA: Diagnosis not present

## 2019-03-01 DIAGNOSIS — R41841 Cognitive communication deficit: Secondary | ICD-10-CM | POA: Diagnosis not present

## 2019-03-01 DIAGNOSIS — Z9181 History of falling: Secondary | ICD-10-CM | POA: Diagnosis not present

## 2019-03-01 DIAGNOSIS — R2681 Unsteadiness on feet: Secondary | ICD-10-CM | POA: Diagnosis not present

## 2019-03-04 DIAGNOSIS — R278 Other lack of coordination: Secondary | ICD-10-CM | POA: Diagnosis not present

## 2019-03-04 DIAGNOSIS — Z9181 History of falling: Secondary | ICD-10-CM | POA: Diagnosis not present

## 2019-03-04 DIAGNOSIS — R41841 Cognitive communication deficit: Secondary | ICD-10-CM | POA: Diagnosis not present

## 2019-03-04 DIAGNOSIS — R2689 Other abnormalities of gait and mobility: Secondary | ICD-10-CM | POA: Diagnosis not present

## 2019-03-04 DIAGNOSIS — R2681 Unsteadiness on feet: Secondary | ICD-10-CM | POA: Diagnosis not present

## 2019-03-04 DIAGNOSIS — M6281 Muscle weakness (generalized): Secondary | ICD-10-CM | POA: Diagnosis not present

## 2019-03-05 DIAGNOSIS — R41841 Cognitive communication deficit: Secondary | ICD-10-CM | POA: Diagnosis not present

## 2019-03-05 DIAGNOSIS — M6281 Muscle weakness (generalized): Secondary | ICD-10-CM | POA: Diagnosis not present

## 2019-03-05 DIAGNOSIS — R2681 Unsteadiness on feet: Secondary | ICD-10-CM | POA: Diagnosis not present

## 2019-03-05 DIAGNOSIS — R278 Other lack of coordination: Secondary | ICD-10-CM | POA: Diagnosis not present

## 2019-03-05 DIAGNOSIS — R2689 Other abnormalities of gait and mobility: Secondary | ICD-10-CM | POA: Diagnosis not present

## 2019-03-05 DIAGNOSIS — Z9181 History of falling: Secondary | ICD-10-CM | POA: Diagnosis not present

## 2019-03-06 DIAGNOSIS — R278 Other lack of coordination: Secondary | ICD-10-CM | POA: Diagnosis not present

## 2019-03-06 DIAGNOSIS — R2689 Other abnormalities of gait and mobility: Secondary | ICD-10-CM | POA: Diagnosis not present

## 2019-03-06 DIAGNOSIS — Z9181 History of falling: Secondary | ICD-10-CM | POA: Diagnosis not present

## 2019-03-06 DIAGNOSIS — M6281 Muscle weakness (generalized): Secondary | ICD-10-CM | POA: Diagnosis not present

## 2019-03-06 DIAGNOSIS — R41841 Cognitive communication deficit: Secondary | ICD-10-CM | POA: Diagnosis not present

## 2019-03-06 DIAGNOSIS — R2681 Unsteadiness on feet: Secondary | ICD-10-CM | POA: Diagnosis not present

## 2019-03-07 DIAGNOSIS — R2689 Other abnormalities of gait and mobility: Secondary | ICD-10-CM | POA: Diagnosis not present

## 2019-03-07 DIAGNOSIS — M6281 Muscle weakness (generalized): Secondary | ICD-10-CM | POA: Diagnosis not present

## 2019-03-07 DIAGNOSIS — Z23 Encounter for immunization: Secondary | ICD-10-CM | POA: Diagnosis not present

## 2019-03-07 DIAGNOSIS — R2681 Unsteadiness on feet: Secondary | ICD-10-CM | POA: Diagnosis not present

## 2019-03-07 DIAGNOSIS — Z9181 History of falling: Secondary | ICD-10-CM | POA: Diagnosis not present

## 2019-03-07 DIAGNOSIS — R278 Other lack of coordination: Secondary | ICD-10-CM | POA: Diagnosis not present

## 2019-03-07 DIAGNOSIS — R41841 Cognitive communication deficit: Secondary | ICD-10-CM | POA: Diagnosis not present

## 2019-03-08 DIAGNOSIS — R2689 Other abnormalities of gait and mobility: Secondary | ICD-10-CM | POA: Diagnosis not present

## 2019-03-08 DIAGNOSIS — M6281 Muscle weakness (generalized): Secondary | ICD-10-CM | POA: Diagnosis not present

## 2019-03-08 DIAGNOSIS — Z9181 History of falling: Secondary | ICD-10-CM | POA: Diagnosis not present

## 2019-03-08 DIAGNOSIS — R278 Other lack of coordination: Secondary | ICD-10-CM | POA: Diagnosis not present

## 2019-03-08 DIAGNOSIS — R2681 Unsteadiness on feet: Secondary | ICD-10-CM | POA: Diagnosis not present

## 2019-03-08 DIAGNOSIS — R41841 Cognitive communication deficit: Secondary | ICD-10-CM | POA: Diagnosis not present

## 2019-03-11 DIAGNOSIS — R2681 Unsteadiness on feet: Secondary | ICD-10-CM | POA: Diagnosis not present

## 2019-03-11 DIAGNOSIS — R2689 Other abnormalities of gait and mobility: Secondary | ICD-10-CM | POA: Diagnosis not present

## 2019-03-11 DIAGNOSIS — Z9181 History of falling: Secondary | ICD-10-CM | POA: Diagnosis not present

## 2019-03-11 DIAGNOSIS — R278 Other lack of coordination: Secondary | ICD-10-CM | POA: Diagnosis not present

## 2019-03-11 DIAGNOSIS — R41841 Cognitive communication deficit: Secondary | ICD-10-CM | POA: Diagnosis not present

## 2019-03-11 DIAGNOSIS — M6281 Muscle weakness (generalized): Secondary | ICD-10-CM | POA: Diagnosis not present

## 2019-03-12 DIAGNOSIS — R41841 Cognitive communication deficit: Secondary | ICD-10-CM | POA: Diagnosis not present

## 2019-03-12 DIAGNOSIS — R2689 Other abnormalities of gait and mobility: Secondary | ICD-10-CM | POA: Diagnosis not present

## 2019-03-12 DIAGNOSIS — M6281 Muscle weakness (generalized): Secondary | ICD-10-CM | POA: Diagnosis not present

## 2019-03-12 DIAGNOSIS — R2681 Unsteadiness on feet: Secondary | ICD-10-CM | POA: Diagnosis not present

## 2019-03-12 DIAGNOSIS — Z9181 History of falling: Secondary | ICD-10-CM | POA: Diagnosis not present

## 2019-03-12 DIAGNOSIS — R278 Other lack of coordination: Secondary | ICD-10-CM | POA: Diagnosis not present

## 2019-03-13 DIAGNOSIS — R2689 Other abnormalities of gait and mobility: Secondary | ICD-10-CM | POA: Diagnosis not present

## 2019-03-13 DIAGNOSIS — Z9181 History of falling: Secondary | ICD-10-CM | POA: Diagnosis not present

## 2019-03-13 DIAGNOSIS — R2681 Unsteadiness on feet: Secondary | ICD-10-CM | POA: Diagnosis not present

## 2019-03-13 DIAGNOSIS — R41841 Cognitive communication deficit: Secondary | ICD-10-CM | POA: Diagnosis not present

## 2019-03-13 DIAGNOSIS — R278 Other lack of coordination: Secondary | ICD-10-CM | POA: Diagnosis not present

## 2019-03-13 DIAGNOSIS — M6281 Muscle weakness (generalized): Secondary | ICD-10-CM | POA: Diagnosis not present

## 2019-03-14 DIAGNOSIS — R278 Other lack of coordination: Secondary | ICD-10-CM | POA: Diagnosis not present

## 2019-03-14 DIAGNOSIS — M6281 Muscle weakness (generalized): Secondary | ICD-10-CM | POA: Diagnosis not present

## 2019-03-14 DIAGNOSIS — R41841 Cognitive communication deficit: Secondary | ICD-10-CM | POA: Diagnosis not present

## 2019-03-14 DIAGNOSIS — Z9181 History of falling: Secondary | ICD-10-CM | POA: Diagnosis not present

## 2019-03-14 DIAGNOSIS — R2689 Other abnormalities of gait and mobility: Secondary | ICD-10-CM | POA: Diagnosis not present

## 2019-03-14 DIAGNOSIS — R2681 Unsteadiness on feet: Secondary | ICD-10-CM | POA: Diagnosis not present

## 2019-03-15 DIAGNOSIS — R2681 Unsteadiness on feet: Secondary | ICD-10-CM | POA: Diagnosis not present

## 2019-03-15 DIAGNOSIS — R2689 Other abnormalities of gait and mobility: Secondary | ICD-10-CM | POA: Diagnosis not present

## 2019-03-15 DIAGNOSIS — R41841 Cognitive communication deficit: Secondary | ICD-10-CM | POA: Diagnosis not present

## 2019-03-15 DIAGNOSIS — Z9181 History of falling: Secondary | ICD-10-CM | POA: Diagnosis not present

## 2019-03-15 DIAGNOSIS — M6281 Muscle weakness (generalized): Secondary | ICD-10-CM | POA: Diagnosis not present

## 2019-03-15 DIAGNOSIS — R278 Other lack of coordination: Secondary | ICD-10-CM | POA: Diagnosis not present

## 2019-03-19 DIAGNOSIS — M6281 Muscle weakness (generalized): Secondary | ICD-10-CM | POA: Diagnosis not present

## 2019-03-19 DIAGNOSIS — R41841 Cognitive communication deficit: Secondary | ICD-10-CM | POA: Diagnosis not present

## 2019-03-19 DIAGNOSIS — Z9181 History of falling: Secondary | ICD-10-CM | POA: Diagnosis not present

## 2019-03-19 DIAGNOSIS — R2689 Other abnormalities of gait and mobility: Secondary | ICD-10-CM | POA: Diagnosis not present

## 2019-03-19 DIAGNOSIS — R278 Other lack of coordination: Secondary | ICD-10-CM | POA: Diagnosis not present

## 2019-03-19 DIAGNOSIS — R2681 Unsteadiness on feet: Secondary | ICD-10-CM | POA: Diagnosis not present

## 2019-03-20 DIAGNOSIS — R2689 Other abnormalities of gait and mobility: Secondary | ICD-10-CM | POA: Diagnosis not present

## 2019-03-20 DIAGNOSIS — R278 Other lack of coordination: Secondary | ICD-10-CM | POA: Diagnosis not present

## 2019-03-20 DIAGNOSIS — Z9181 History of falling: Secondary | ICD-10-CM | POA: Diagnosis not present

## 2019-03-20 DIAGNOSIS — M6281 Muscle weakness (generalized): Secondary | ICD-10-CM | POA: Diagnosis not present

## 2019-03-20 DIAGNOSIS — R2681 Unsteadiness on feet: Secondary | ICD-10-CM | POA: Diagnosis not present

## 2019-03-20 DIAGNOSIS — R41841 Cognitive communication deficit: Secondary | ICD-10-CM | POA: Diagnosis not present

## 2019-03-22 DIAGNOSIS — R278 Other lack of coordination: Secondary | ICD-10-CM | POA: Diagnosis not present

## 2019-03-22 DIAGNOSIS — M6281 Muscle weakness (generalized): Secondary | ICD-10-CM | POA: Diagnosis not present

## 2019-03-22 DIAGNOSIS — R2681 Unsteadiness on feet: Secondary | ICD-10-CM | POA: Diagnosis not present

## 2019-03-22 DIAGNOSIS — R41841 Cognitive communication deficit: Secondary | ICD-10-CM | POA: Diagnosis not present

## 2019-03-22 DIAGNOSIS — Z9181 History of falling: Secondary | ICD-10-CM | POA: Diagnosis not present

## 2019-03-22 DIAGNOSIS — R2689 Other abnormalities of gait and mobility: Secondary | ICD-10-CM | POA: Diagnosis not present

## 2019-03-25 DIAGNOSIS — R41841 Cognitive communication deficit: Secondary | ICD-10-CM | POA: Diagnosis not present

## 2019-03-25 DIAGNOSIS — R2681 Unsteadiness on feet: Secondary | ICD-10-CM | POA: Diagnosis not present

## 2019-03-25 DIAGNOSIS — Z9181 History of falling: Secondary | ICD-10-CM | POA: Diagnosis not present

## 2019-03-25 DIAGNOSIS — R2689 Other abnormalities of gait and mobility: Secondary | ICD-10-CM | POA: Diagnosis not present

## 2019-03-25 DIAGNOSIS — R278 Other lack of coordination: Secondary | ICD-10-CM | POA: Diagnosis not present

## 2019-03-25 DIAGNOSIS — M6281 Muscle weakness (generalized): Secondary | ICD-10-CM | POA: Diagnosis not present

## 2019-03-27 DIAGNOSIS — M6281 Muscle weakness (generalized): Secondary | ICD-10-CM | POA: Diagnosis not present

## 2019-03-27 DIAGNOSIS — R41841 Cognitive communication deficit: Secondary | ICD-10-CM | POA: Diagnosis not present

## 2019-03-27 DIAGNOSIS — R2689 Other abnormalities of gait and mobility: Secondary | ICD-10-CM | POA: Diagnosis not present

## 2019-03-27 DIAGNOSIS — R278 Other lack of coordination: Secondary | ICD-10-CM | POA: Diagnosis not present

## 2019-03-27 DIAGNOSIS — Z9181 History of falling: Secondary | ICD-10-CM | POA: Diagnosis not present

## 2019-03-27 DIAGNOSIS — R2681 Unsteadiness on feet: Secondary | ICD-10-CM | POA: Diagnosis not present

## 2019-03-28 DIAGNOSIS — Z9181 History of falling: Secondary | ICD-10-CM | POA: Diagnosis not present

## 2019-03-28 DIAGNOSIS — M6281 Muscle weakness (generalized): Secondary | ICD-10-CM | POA: Diagnosis not present

## 2019-03-28 DIAGNOSIS — R41841 Cognitive communication deficit: Secondary | ICD-10-CM | POA: Diagnosis not present

## 2019-03-28 DIAGNOSIS — R278 Other lack of coordination: Secondary | ICD-10-CM | POA: Diagnosis not present

## 2019-03-28 DIAGNOSIS — R2681 Unsteadiness on feet: Secondary | ICD-10-CM | POA: Diagnosis not present

## 2019-03-28 DIAGNOSIS — R2689 Other abnormalities of gait and mobility: Secondary | ICD-10-CM | POA: Diagnosis not present

## 2019-04-09 DIAGNOSIS — Z1159 Encounter for screening for other viral diseases: Secondary | ICD-10-CM | POA: Diagnosis not present

## 2019-07-09 DIAGNOSIS — I87322 Chronic venous hypertension (idiopathic) with inflammation of left lower extremity: Secondary | ICD-10-CM | POA: Diagnosis not present

## 2019-07-09 DIAGNOSIS — I739 Peripheral vascular disease, unspecified: Secondary | ICD-10-CM | POA: Diagnosis not present

## 2019-07-09 DIAGNOSIS — M7989 Other specified soft tissue disorders: Secondary | ICD-10-CM | POA: Diagnosis not present

## 2019-07-12 DIAGNOSIS — L03116 Cellulitis of left lower limb: Secondary | ICD-10-CM | POA: Diagnosis not present

## 2019-07-12 DIAGNOSIS — Z03818 Encounter for observation for suspected exposure to other biological agents ruled out: Secondary | ICD-10-CM | POA: Diagnosis not present

## 2019-07-12 DIAGNOSIS — R600 Localized edema: Secondary | ICD-10-CM | POA: Diagnosis not present

## 2019-07-12 DIAGNOSIS — Z20828 Contact with and (suspected) exposure to other viral communicable diseases: Secondary | ICD-10-CM | POA: Diagnosis not present

## 2019-07-29 ENCOUNTER — Emergency Department (HOSPITAL_BASED_OUTPATIENT_CLINIC_OR_DEPARTMENT_OTHER): Payer: Medicare Other

## 2019-07-29 ENCOUNTER — Other Ambulatory Visit: Payer: Self-pay

## 2019-07-29 ENCOUNTER — Emergency Department (HOSPITAL_COMMUNITY)
Admission: EM | Admit: 2019-07-29 | Discharge: 2019-07-29 | Disposition: A | Discharge status: Home / Self Care | Payer: Medicare Other | Attending: Emergency Medicine | Admitting: Emergency Medicine

## 2019-07-29 DIAGNOSIS — Z79899 Other long term (current) drug therapy: Secondary | ICD-10-CM | POA: Diagnosis not present

## 2019-07-29 DIAGNOSIS — R52 Pain, unspecified: Secondary | ICD-10-CM

## 2019-07-29 DIAGNOSIS — R609 Edema, unspecified: Secondary | ICD-10-CM | POA: Diagnosis not present

## 2019-07-29 DIAGNOSIS — R2243 Localized swelling, mass and lump, lower limb, bilateral: Secondary | ICD-10-CM | POA: Diagnosis present

## 2019-07-29 DIAGNOSIS — R600 Localized edema: Secondary | ICD-10-CM | POA: Insufficient documentation

## 2019-07-29 DIAGNOSIS — L03116 Cellulitis of left lower limb: Secondary | ICD-10-CM | POA: Diagnosis not present

## 2019-07-29 DIAGNOSIS — M7989 Other specified soft tissue disorders: Secondary | ICD-10-CM | POA: Diagnosis not present

## 2019-07-29 LAB — BASIC METABOLIC PANEL
Anion gap: 13 (ref 5–15)
BUN: 17 mg/dL (ref 8–23)
CO2: 23 mmol/L (ref 22–32)
Calcium: 9.2 mg/dL (ref 8.9–10.3)
Chloride: 101 mmol/L (ref 98–111)
Creatinine, Ser: 0.58 mg/dL (ref 0.44–1.00)
GFR calc Af Amer: >60 mL/min (ref >60)
GFR calc non Af Amer: >60 mL/min (ref >60)
Glucose, Bld: 105 mg/dL — ABNORMAL HIGH (ref 70–99)
Potassium: 3.9 mmol/L (ref 3.5–5.1)
Sodium: 137 mmol/L (ref 135–145)

## 2019-07-29 LAB — CBC WITH DIFFERENTIAL/PLATELET
Abs Immature Granulocytes: 0.04 10*3/uL (ref 0.00–0.07)
Basophils Absolute: 0.1 10*3/uL (ref 0.0–0.1)
Basophils Relative: 1 %
Eosinophils Absolute: 0.3 10*3/uL (ref 0.0–0.5)
Eosinophils Relative: 3 %
HCT: 39.8 % (ref 36.0–46.0)
Hemoglobin: 13.4 g/dL (ref 12.0–15.0)
Immature Granulocytes: 1 %
Lymphocytes Relative: 9 %
Lymphs Abs: 0.8 10*3/uL (ref 0.7–4.0)
MCH: 32.2 pg (ref 26.0–34.0)
MCHC: 33.7 g/dL (ref 30.0–36.0)
MCV: 95.7 fL (ref 80.0–100.0)
Monocytes Absolute: 1 10*3/uL (ref 0.1–1.0)
Monocytes Relative: 12 %
Neutro Abs: 6.7 10*3/uL (ref 1.7–7.7)
Neutrophils Relative %: 74 %
Platelets: 299 10*3/uL (ref 150–400)
RBC: 4.16 MIL/uL (ref 3.87–5.11)
RDW: 14.5 % (ref 11.5–15.5)
WBC: 8.8 10*3/uL (ref 4.0–10.5)
nRBC: 0 % (ref 0.0–0.2)

## 2019-07-29 MED ORDER — DOXYCYCLINE HYCLATE 100 MG PO TABS: 100.0000 mg | ORAL_TABLET | Freq: Two times a day (BID) | ORAL | 0 refills | Status: DC

## 2019-07-29 NOTE — ED Notes (Signed)
Patient verbalizes understanding of discharge instructions. Opportunity for questioning and answers were provided. Armband removed by staff, pt discharged from ED.  

## 2019-07-29 NOTE — Discharge Instructions (Addendum)
Take the antibiotics as prescribed for possible skin infection.  Your blood tests were normal today.  The ultrasound did not show a blood clot.  Follow-up with your doctor to make sure you are improving.

## 2019-07-29 NOTE — Progress Notes (Signed)
Left lower extremity venous duplex exam performed.  Preliminary results can be found under CV proc under chart review.  07/29/2019 3:59 PM  Dacey Milberger, K., RDMS, RVT

## 2019-07-29 NOTE — ED Triage Notes (Signed)
Pt arrives via POV from home with left leg swelling/reddness over the last 2 weeks. Sent for r/o DVT/cellulitis workup. Pt NAD at present.

## 2019-07-29 NOTE — ED Notes (Signed)
Randall Hiss and Leotis Shames, managers of [facility], to be called when pt is ready for discharge. [phone]

## 2019-07-29 NOTE — ED Provider Notes (Signed)
[place] [facility] EMERGENCY DEPARTMENT Provider Note   CSN: [id number] Arrival date & time: 07/29/19  1424     History Chief Complaint  Patient presents with  . Leg Swelling    Sierra Allen is a 86 y.o. female.  HPI    Patient presents ED for evaluation of leg swelling.  Patient states she has been having trouble for the last couple of weeks.  She was sent from the [facility] walk-in clinic to have a Doppler ultrasound study.  Patient denies having any fevers or chills.  No chest pain or shortness of breath.  She does not have a history of DVT.  Past Medical History:  Diagnosis Date  . Cancer [facility])    Brest Cancer    There are no problems to display for this patient.   Past Surgical History:  Procedure Laterality Date  . BACK SURGERY    . Femur Rod    . LYMPHADENECTOMY     Right arm  . TONSILLECTOMY       OB History    Gravida      Para      Term      Preterm      AB      Living  2     SAB      TAB      Ectopic      Multiple      Live Births              No family history on file.  Social History   Tobacco Use  . Smoking status: Never Smoker  Substance Use Topics  . Alcohol use: Not Currently  . Drug use: Not Currently    Home Medications Prior to Admission medications   Medication Sig Start Date End Date Taking? Authorizing Provider  CALCIUM-MAGNESIUM-ZINC PO Take 1 tablet by mouth daily.   Yes [provider]  vitamin C (ASCORBIC ACID) 500 MG tablet Take 500 mg by mouth every other day.    Yes [provider]  doxycycline (VIBRA-TABS) 100 MG tablet Take 1 tablet (100 mg total) by mouth 2 (two) times daily. 07/29/19   Dorie Rank, MD    Allergies    Penicillins and Shellfish allergy  Review of Systems   Review of Systems  All other systems reviewed and are negative.   Physical Exam Updated Vital Signs BP (!) 163/92 (BP Location: Right Arm)   Pulse 91   Temp 97.9 F (36.6 C) (Oral)   Resp  16   Ht 1.6 m (5\' 3" )   Wt 54.4 kg   SpO2 99%   BMI 21.26 kg/m   Physical Exam Vitals and nursing note reviewed.  Constitutional:      Appearance: She is well-developed.     Comments: Elderly  HENT:     Head: Normocephalic and atraumatic.     Right Ear: External ear normal.     Left Ear: External ear normal.  Eyes:     General: No scleral icterus.       Right eye: No discharge.        Left eye: No discharge.     Conjunctiva/sclera: Conjunctivae normal.  Neck:     Trachea: No tracheal deviation.  Cardiovascular:     Rate and Rhythm: Normal rate and regular rhythm.  Pulmonary:     Effort: Pulmonary effort is normal. No respiratory distress.     Breath sounds: Normal breath sounds. No stridor. No wheezing or rales.  Abdominal:     General: Bowel sounds are normal. There is no distension.     Palpations: Abdomen is soft.     Tenderness: There is no abdominal tenderness. There is no guarding or rebound.  Musculoskeletal:        General: No tenderness.     Cervical back: Neck supple.     Right lower leg: Edema present.     Left lower leg: Edema present.     Comments: Bilateral edema left greater than right, erythema of the left lower extremity, some serous drainage weeping from the skin  Skin:    General: Skin is warm and dry.     Findings: No rash.  Neurological:     Mental Status: She is alert.     Cranial Nerves: No cranial nerve deficit (no facial droop, extraocular movements intact, no slurred speech).     Sensory: No sensory deficit.     Motor: No abnormal muscle tone or seizure activity.     Coordination: Coordination normal.     ED Results / Procedures / Treatments   Labs (all labs ordered are listed, but only abnormal results are displayed) Labs Reviewed  BASIC METABOLIC PANEL - Abnormal; Notable for the following components:      Result Value   Glucose, Bld 105 (*)    All other components within normal limits  CBC WITH DIFFERENTIAL/PLATELET     EKG None  Radiology VAS [place] LOWER EXTREMITY VENOUS (DVT) (ONLY [facility] & [facility])  Result Date: 07/30/2019  Lower Venous DVTStudy Indications: Edema, and Pain.  Limitations: Poor ultrasound/tissue interface. Comparison Study: No prior exam. Performing Technologist: Baldwin Crown ARDMS, RVT  Examination Guidelines: A complete evaluation includes B-mode imaging, spectral Doppler, color Doppler, and power Doppler as needed of all accessible portions of each vessel. Bilateral testing is considered an integral part of a complete examination. Limited examinations for reoccurring indications may be performed as noted. The reflux portion of the exam is performed with the patient in reverse Trendelenburg.  +-----+---------------+---------+-----------+----------+--------------+ RIGHTCompressibilityPhasicitySpontaneityPropertiesThrombus Aging +-----+---------------+---------+-----------+----------+--------------+ CFV  Full           Yes      Yes                                 +-----+---------------+---------+-----------+----------+--------------+   +---------+---------------+---------+-----------+----------+--------------+ LEFT     CompressibilityPhasicitySpontaneityPropertiesThrombus Aging +---------+---------------+---------+-----------+----------+--------------+ CFV      Full           Yes      Yes                                 +---------+---------------+---------+-----------+----------+--------------+ SFJ      Full                                                        +---------+---------------+---------+-----------+----------+--------------+ FV Prox  Full                                                        +---------+---------------+---------+-----------+----------+--------------+ FV Mid   Full                                                        +---------+---------------+---------+-----------+----------+--------------+  FV DistalFull                                                         +---------+---------------+---------+-----------+----------+--------------+ PFV      Full                                                        +---------+---------------+---------+-----------+----------+--------------+ POP      Full           Yes      Yes                                 +---------+---------------+---------+-----------+----------+--------------+ PTV      Full                                                        +---------+---------------+---------+-----------+----------+--------------+ PERO     Full                                                        +---------+---------------+---------+-----------+----------+--------------+     Summary: RIGHT: - No evidence of common femoral vein obstruction.  LEFT: - There is no evidence of deep vein thrombosis in the lower extremity.  - No cystic structure found in the popliteal fossa.  *See table(s) above for measurements and observations. Electronically signed by Ruta Hinds MD on 07/30/2019 at 11:46:16 AM.    Final     Procedures Procedures (including critical care time)  Medications Ordered in ED Medications - No data to display  ED Course  I have reviewed the triage vital signs and the nursing notes.  Pertinent labs & imaging results that were available during my care of the patient were reviewed by me and considered in my medical decision making (see chart for details).  Clinical Course as of Jul 30 1247  Mon Jul 29, 2019  1655 Some confusion initially as patient felt that she was here just for the ultrasound test.  I explained to the patient that the note indicated she was sent to the ED to have that test and that generally an ER provider will see her.  I explained to her that if she did not want to be seen and she did not have to be seen but it does look like her doctor wanted her to be evaluated for infection.  She does agree to have blood tests.  She does not want to  get undressed.   [JK]    Clinical Course User Index [JK] Dorie Rank, MD   MDM Rules/Calculators/A&P                      Labs reassuring.  NO DVT.  Pt non toxic. No signs of sepsis.  WIll treat for  possible cellulitis. Final Clinical Impression(s) / ED Diagnoses Final diagnoses:  Peripheral edema    Rx / DC Orders ED Discharge Orders         Ordered    doxycycline (VIBRA-TABS) 100 MG tablet  2 times daily     07/29/19 1901           Dorie Rank, MD 07/30/19 1249

## 2019-08-01 DIAGNOSIS — L03116 Cellulitis of left lower limb: Secondary | ICD-10-CM | POA: Diagnosis not present

## 2019-09-26 ENCOUNTER — Encounter: Payer: Medicare Other | Admitting: Vascular Surgery

## 2019-09-26 ENCOUNTER — Encounter (HOSPITAL_COMMUNITY): Payer: Medicare Other

## 2019-10-18 ENCOUNTER — Other Ambulatory Visit: Payer: Self-pay | Admitting: *Deleted

## 2019-10-18 DIAGNOSIS — M25561 Pain in right knee: Secondary | ICD-10-CM

## 2019-10-21 ENCOUNTER — Other Ambulatory Visit: Payer: Self-pay | Admitting: *Deleted

## 2019-10-21 DIAGNOSIS — M25562 Pain in left knee: Secondary | ICD-10-CM

## 2019-10-22 ENCOUNTER — Other Ambulatory Visit: Payer: Self-pay

## 2019-10-22 ENCOUNTER — Ambulatory Visit (HOSPITAL_COMMUNITY)
Admission: RE | Admit: 2019-10-22 | Discharge: 2019-10-22 | Disposition: A | Discharge status: Home / Self Care | Payer: Medicare Other | Source: Ambulatory Visit | Attending: Vascular Surgery | Admitting: Vascular Surgery

## 2019-10-22 DIAGNOSIS — M25561 Pain in right knee: Secondary | ICD-10-CM | POA: Diagnosis not present

## 2019-10-22 DIAGNOSIS — M25562 Pain in left knee: Secondary | ICD-10-CM | POA: Diagnosis not present

## 2019-10-24 ENCOUNTER — Encounter: Payer: Self-pay | Admitting: Vascular Surgery

## 2019-10-24 ENCOUNTER — Ambulatory Visit (INDEPENDENT_AMBULATORY_CARE_PROVIDER_SITE_OTHER): Payer: Medicare Other | Admitting: Vascular Surgery

## 2019-10-24 ENCOUNTER — Other Ambulatory Visit: Payer: Self-pay

## 2019-10-24 ENCOUNTER — Ambulatory Visit (HOSPITAL_COMMUNITY)
Admission: RE | Admit: 2019-10-24 | Discharge: 2019-10-24 | Disposition: A | Discharge status: Home / Self Care | Payer: Medicare Other | Source: Ambulatory Visit | Attending: Vascular Surgery | Admitting: Vascular Surgery

## 2019-10-24 VITALS — BP 162/75 | HR 73 | Temp 98.0°F | Resp 20 | Ht 63.0 in | Wt 116.0 lb

## 2019-10-24 DIAGNOSIS — I872 Venous insufficiency (chronic) (peripheral): Secondary | ICD-10-CM | POA: Diagnosis not present

## 2019-10-24 DIAGNOSIS — M25562 Pain in left knee: Secondary | ICD-10-CM

## 2019-10-24 DIAGNOSIS — M25561 Pain in right knee: Secondary | ICD-10-CM

## 2019-10-24 NOTE — Progress Notes (Signed)
Referring Physician: Dr. Leighton Ruff  Patient name: Sierra Allen MRN: [id number] DOB: 1927/05/10 Sex: female  REASON FOR CONSULT: Left leg cellulitis  HPI: Sierra Allen is a 86 y.o. female, who is recovering from her recent bout of left leg cellulitis.  She was on oral antibiotics for this but is currently off.  She states the leg is better.  She does not really have any pain in the left leg but has some itching in the skin.  She has never had a prior episode of cellulitis that she can recall.  She has had chronic swelling in the left leg for many years.  She had been told in the past to wear compression stockings but was not really able to get these on her lower extremities.  He does not complain of claudication or rest pain.  She has really no nonhealing wounds although she does have a area of eschar on the lateral aspect of her left leg.    Past Medical History:  Diagnosis Date  . Cancer [facility])    [place] Cancer   Past Surgical History:  Procedure Laterality Date  . BACK SURGERY    . Femur Rod    . LYMPHADENECTOMY     Right arm  . TONSILLECTOMY      History reviewed. No pertinent family history.  SOCIAL HISTORY: Social History   Socioeconomic History  . Marital status: Widowed    Spouse name: Not on file  . Number of children: Not on file  . Years of education: Not on file  . Highest education level: Not on file  Occupational History  . Not on file  Tobacco Use  . Smoking status: Never Smoker  . Smokeless tobacco: Never Used  Substance and Sexual Activity  . Alcohol use: Not Currently  . Drug use: Not Currently  . Sexual activity: Not Currently  Other Topics Concern  . Not on file  Social History Narrative  . Not on file   Social Determinants of Health   Financial Resource Strain:   . Difficulty of Paying Living Expenses:   Food Insecurity:   . Worried About Charity fundraiser in the Last Year:   . Arboriculturist in the Last Year:     Transportation Needs:   . Film/video editor (Medical):   Marland Kitchen Lack of Transportation (Non-Medical):   Physical Activity:   . Days of Exercise per Week:   . Minutes of Exercise per Session:   Stress:   . Feeling of Stress :   Social Connections:   . Frequency of Communication with Friends and Family:   . Frequency of Social Gatherings with Friends and Family:   . Attends Religious Services:   . Active Member of Clubs or Organizations:   . Attends Archivist Meetings:   Marland Kitchen Marital Status:   Intimate Partner Violence:   . Fear of Current or Ex-Partner:   . Emotionally Abused:   Marland Kitchen Physically Abused:   . Sexually Abused:     Allergies  Allergen Reactions  . Penicillins Swelling    Did it involve swelling of the face/tongue/throat, SOB, or low BP? Unknown Did it involve sudden or severe rash/hives, skin peeling, or any reaction on the inside of your mouth or nose? Unknown Did you need to seek medical attention at a hospital or doctor's office? Unknown When did it last happen?Childhood rxn If all above answers are "NO", may proceed with cephalosporin use.  Marland Kitchen Shellfish Allergy  Hives and Swelling    Lips and tongue swelling    Current Outpatient Medications  Medication Sig Dispense Refill  . CALCIUM-MAGNESIUM-ZINC PO Take 1 tablet by mouth daily.    . vitamin C (ASCORBIC ACID) 500 MG tablet Take 500 mg by mouth every other day.      No current facility-administered medications for this visit.    ROS:   General:  No weight loss, Fever, chills  HEENT: No recent headaches, no nasal bleeding, no visual changes, no sore throat  Neurologic: No dizziness, blackouts, seizures. No recent symptoms of stroke or mini- stroke. No recent episodes of slurred speech, or temporary blindness.  Cardiac: No recent episodes of chest pain/pressure, no shortness of breath at rest.  No shortness of breath with exertion.  Denies history of atrial fibrillation or irregular  heartbeat  Vascular: No history of rest pain in feet.  No history of claudication.  No history of non-healing ulcer, No history of DVT   Pulmonary: No home oxygen, no productive cough, no hemoptysis,  No asthma or wheezing  Musculoskeletal:  [ ]  Arthritis, [ ]  Low back pain,  [ ]  Joint pain  Hematologic:No history of hypercoagulable state.  No history of easy bleeding.  No history of anemia  Gastrointestinal: No hematochezia or melena,  No gastroesophageal reflux, no trouble swallowing  Urinary: [ ]  chronic Kidney disease, [ ]  on HD - [ ]  MWF or [ ]  TTHS, [ ]  Burning with urination, [ ]  Frequent urination, [ ]  Difficulty urinating;   Skin: No rashes  Psychological: No history of anxiety,  No history of depression   Physical Examination  Vitals:   10/24/19 1117  BP: (!) 162/75  Pulse: 73  Resp: 20  Temp: 98 F (36.7 C)  SpO2: 96%  Weight: 116 lb (52.6 kg)  Height: 5\' 3"  (1.6 m)    Body mass index is 20.55 kg/m.  General:  Alert and oriented, no acute distress HEENT: Normal Neck: No JVD Cardiac: Regular Rate and Rhythm Abdomen: Soft, non-tender, non-distended, no mass Skin: No rash, left leg has circumferential erythema between the knee and ankle there are 3 areas of dry eschar on the lateral aspect of the left leg all in various states of healing Extremity Pulses:  2+ radial, brachial, femoral, 1+ dorsalis pedis, absent posterior tibial pulses bilaterally Musculoskeletal: No deformity trace left leg edema proximately 5% larger than the right leg  Neurologic: Upper and lower extremity motor 5/5 and symmetric  DATA:  Patient had a venous reflux exam today which showed no superficial reflux in the greater or lesser saphenous vein.  She had diffuse deep vein reflux.  ASSESSMENT: Patient with adequate arterial circulation for wound healing.  Her venous duplex shows that she has deep vein reflux with no superficial component.  Mainstay of therapy for this is compression.  I  discussed this with the patient today.  She states that she cannot wear compression stockings as she cannot get these on her legs.  Therefore today we instructed her on how to use Ace wraps to compress her legs to try to decrease swelling and prevent skin injury in her left leg and recurrent cellulitis.   PLAN: Patient will try to put an Ace wrap on her left leg which is snug and wear this during the day every day.  She will replace the Ace wrap intermittently.  Hopefully this will prevent recurrent cellulitis.  However I did discuss with the patient that deep vein problems can have  a series of exacerbation in remission and can cause these nuisance symptoms.  I did reassure her that she is not at risk of limb loss.  She will follow-up with [place] on an as-needed basis.   Ruta Hinds, MD [facility] Office: [phone] Pager: [phone]

## 2020-02-28 DIAGNOSIS — Z23 Encounter for immunization: Secondary | ICD-10-CM | POA: Diagnosis not present

## 2020-05-12 DIAGNOSIS — R5381 Other malaise: Secondary | ICD-10-CM | POA: Diagnosis not present

## 2020-05-12 DIAGNOSIS — L02419 Cutaneous abscess of limb, unspecified: Secondary | ICD-10-CM | POA: Diagnosis not present

## 2020-05-14 DIAGNOSIS — R7989 Other specified abnormal findings of blood chemistry: Secondary | ICD-10-CM | POA: Diagnosis not present

## 2020-05-14 DIAGNOSIS — E878 Other disorders of electrolyte and fluid balance, not elsewhere classified: Secondary | ICD-10-CM | POA: Diagnosis not present

## 2020-05-14 DIAGNOSIS — I87322 Chronic venous hypertension (idiopathic) with inflammation of left lower extremity: Secondary | ICD-10-CM | POA: Diagnosis not present

## 2020-05-14 DIAGNOSIS — L02419 Cutaneous abscess of limb, unspecified: Secondary | ICD-10-CM | POA: Diagnosis not present

## 2020-05-21 ENCOUNTER — Inpatient Hospital Stay (HOSPITAL_COMMUNITY)
Admission: EM | Admit: 2020-05-21 | Discharge: 2020-06-06 | DRG: 951 | Disposition: E | Payer: Medicare Other | Source: Ambulatory Visit | Attending: Internal Medicine | Admitting: Internal Medicine

## 2020-05-21 ENCOUNTER — Emergency Department (HOSPITAL_COMMUNITY): Payer: Medicare Other

## 2020-05-21 DIAGNOSIS — M810 Age-related osteoporosis without current pathological fracture: Secondary | ICD-10-CM | POA: Diagnosis present

## 2020-05-21 DIAGNOSIS — J189 Pneumonia, unspecified organism: Secondary | ICD-10-CM | POA: Diagnosis present

## 2020-05-21 DIAGNOSIS — N170 Acute kidney failure with tubular necrosis: Secondary | ICD-10-CM | POA: Diagnosis present

## 2020-05-21 DIAGNOSIS — N179 Acute kidney failure, unspecified: Secondary | ICD-10-CM

## 2020-05-21 DIAGNOSIS — Z79899 Other long term (current) drug therapy: Secondary | ICD-10-CM | POA: Diagnosis not present

## 2020-05-21 DIAGNOSIS — D65 Disseminated intravascular coagulation [defibrination syndrome]: Secondary | ICD-10-CM | POA: Diagnosis present

## 2020-05-21 DIAGNOSIS — R195 Other fecal abnormalities: Secondary | ICD-10-CM | POA: Diagnosis present

## 2020-05-21 DIAGNOSIS — E875 Hyperkalemia: Secondary | ICD-10-CM | POA: Diagnosis not present

## 2020-05-21 DIAGNOSIS — E860 Dehydration: Secondary | ICD-10-CM | POA: Diagnosis present

## 2020-05-21 DIAGNOSIS — I998 Other disorder of circulatory system: Secondary | ICD-10-CM | POA: Diagnosis not present

## 2020-05-21 DIAGNOSIS — Z7189 Other specified counseling: Secondary | ICD-10-CM

## 2020-05-21 DIAGNOSIS — R6521 Severe sepsis with septic shock: Secondary | ICD-10-CM | POA: Diagnosis present

## 2020-05-21 DIAGNOSIS — F419 Anxiety disorder, unspecified: Secondary | ICD-10-CM | POA: Diagnosis present

## 2020-05-21 DIAGNOSIS — G9341 Metabolic encephalopathy: Secondary | ICD-10-CM | POA: Diagnosis present

## 2020-05-21 DIAGNOSIS — Z515 Encounter for palliative care: Secondary | ICD-10-CM | POA: Diagnosis not present

## 2020-05-21 DIAGNOSIS — Z91013 Allergy to seafood: Secondary | ICD-10-CM | POA: Diagnosis not present

## 2020-05-21 DIAGNOSIS — E162 Hypoglycemia, unspecified: Secondary | ICD-10-CM

## 2020-05-21 DIAGNOSIS — H9190 Unspecified hearing loss, unspecified ear: Secondary | ICD-10-CM | POA: Diagnosis present

## 2020-05-21 DIAGNOSIS — M3119 Other thrombotic microangiopathy: Secondary | ICD-10-CM | POA: Diagnosis present

## 2020-05-21 DIAGNOSIS — Z20822 Contact with and (suspected) exposure to covid-19: Secondary | ICD-10-CM | POA: Diagnosis present

## 2020-05-21 DIAGNOSIS — Z853 Personal history of malignant neoplasm of breast: Secondary | ICD-10-CM | POA: Diagnosis not present

## 2020-05-21 DIAGNOSIS — A419 Sepsis, unspecified organism: Secondary | ICD-10-CM | POA: Diagnosis present

## 2020-05-21 DIAGNOSIS — Z880 Allergy status to penicillin: Secondary | ICD-10-CM | POA: Diagnosis not present

## 2020-05-21 DIAGNOSIS — M62261 Nontraumatic ischemic infarction of muscle, right lower leg: Secondary | ICD-10-CM | POA: Diagnosis not present

## 2020-05-21 DIAGNOSIS — M62262 Nontraumatic ischemic infarction of muscle, left lower leg: Secondary | ICD-10-CM | POA: Diagnosis not present

## 2020-05-21 DIAGNOSIS — I4891 Unspecified atrial fibrillation: Secondary | ICD-10-CM | POA: Diagnosis present

## 2020-05-21 DIAGNOSIS — J90 Pleural effusion, not elsewhere classified: Secondary | ICD-10-CM | POA: Diagnosis present

## 2020-05-21 DIAGNOSIS — R109 Unspecified abdominal pain: Secondary | ICD-10-CM | POA: Diagnosis present

## 2020-05-21 DIAGNOSIS — Z66 Do not resuscitate: Secondary | ICD-10-CM

## 2020-05-21 DIAGNOSIS — E559 Vitamin D deficiency, unspecified: Secondary | ICD-10-CM | POA: Diagnosis present

## 2020-05-21 DIAGNOSIS — I10 Essential (primary) hypertension: Secondary | ICD-10-CM | POA: Diagnosis not present

## 2020-05-21 DIAGNOSIS — R680 Hypothermia, not associated with low environmental temperature: Secondary | ICD-10-CM | POA: Diagnosis present

## 2020-05-21 DIAGNOSIS — R001 Bradycardia, unspecified: Secondary | ICD-10-CM | POA: Diagnosis not present

## 2020-05-21 DIAGNOSIS — R531 Weakness: Secondary | ICD-10-CM | POA: Diagnosis not present

## 2020-05-21 DIAGNOSIS — I472 Ventricular tachycardia: Secondary | ICD-10-CM | POA: Diagnosis present

## 2020-05-21 DIAGNOSIS — I959 Hypotension, unspecified: Secondary | ICD-10-CM | POA: Diagnosis not present

## 2020-05-21 DIAGNOSIS — Z789 Other specified health status: Secondary | ICD-10-CM | POA: Diagnosis not present

## 2020-05-21 DIAGNOSIS — R000 Tachycardia, unspecified: Secondary | ICD-10-CM | POA: Diagnosis not present

## 2020-05-21 DIAGNOSIS — E872 Acidosis: Secondary | ICD-10-CM | POA: Diagnosis present

## 2020-05-21 DIAGNOSIS — R0902 Hypoxemia: Secondary | ICD-10-CM | POA: Diagnosis not present

## 2020-05-21 DIAGNOSIS — I739 Peripheral vascular disease, unspecified: Secondary | ICD-10-CM | POA: Diagnosis present

## 2020-05-21 LAB — CBC WITH DIFFERENTIAL/PLATELET
Abs Immature Granulocytes: 0.07 10*3/uL (ref 0.00–0.07)
Basophils Absolute: 0 10*3/uL (ref 0.0–0.1)
Basophils Relative: 0 %
Eosinophils Absolute: 0 10*3/uL (ref 0.0–0.5)
Eosinophils Relative: 0 %
HCT: 59.3 % — ABNORMAL HIGH (ref 36.0–46.0)
Hemoglobin: 19.1 g/dL — ABNORMAL HIGH (ref 12.0–15.0)
Immature Granulocytes: 0 %
Lymphocytes Relative: 2 %
Lymphs Abs: 0.3 10*3/uL — ABNORMAL LOW (ref 0.7–4.0)
MCH: 31.6 pg (ref 26.0–34.0)
MCHC: 32.2 g/dL (ref 30.0–36.0)
MCV: 98.2 fL (ref 80.0–100.0)
Monocytes Absolute: 0.9 10*3/uL (ref 0.1–1.0)
Monocytes Relative: 5 %
Neutro Abs: 15.8 10*3/uL — ABNORMAL HIGH (ref 1.7–7.7)
Neutrophils Relative %: 93 %
Platelets: 34 10*3/uL — ABNORMAL LOW (ref 150–400)
RBC: 6.04 MIL/uL — ABNORMAL HIGH (ref 3.87–5.11)
RDW: 18.7 % — ABNORMAL HIGH (ref 11.5–15.5)
WBC: 17.1 10*3/uL — ABNORMAL HIGH (ref 4.0–10.5)
nRBC: 0.9 % — ABNORMAL HIGH (ref 0.0–0.2)

## 2020-05-21 LAB — RESP PANEL BY RT-PCR (FLU A&B, COVID) ARPGX2
Influenza A by PCR: NEGATIVE
Influenza B by PCR: NEGATIVE
SARS Coronavirus 2 by RT PCR: NEGATIVE

## 2020-05-21 LAB — CBG MONITORING, ED
Glucose-Capillary: 61 mg/dL — ABNORMAL LOW (ref 70–99)
Glucose-Capillary: 182 mg/dL — ABNORMAL HIGH (ref 70–99)

## 2020-05-21 LAB — I-STAT VENOUS BLOOD GAS, ED
Acid-base deficit: 5 mmol/L — ABNORMAL HIGH (ref 0.0–2.0)
Bicarbonate: 18 mmol/L — ABNORMAL LOW (ref 20.0–28.0)
Calcium, Ion: 0.96 mmol/L — ABNORMAL LOW (ref 1.15–1.40)
HCT: 49 % — ABNORMAL HIGH (ref 36.0–46.0)
Hemoglobin: 16.7 g/dL — ABNORMAL HIGH (ref 12.0–15.0)
O2 Saturation: 91 %
Potassium: 5.9 mmol/L — ABNORMAL HIGH (ref 3.5–5.1)
Sodium: 141 mmol/L (ref 135–145)
TCO2: 19 mmol/L — ABNORMAL LOW (ref 22–32)
pCO2, Ven: 29.2 mmHg — ABNORMAL LOW (ref 44.0–60.0)
pH, Ven: 7.398 (ref 7.250–7.430)
pO2, Ven: 61 mmHg — ABNORMAL HIGH (ref 32.0–45.0)

## 2020-05-21 LAB — BRAIN NATRIURETIC PEPTIDE: B Natriuretic Peptide: 564.7 pg/mL — ABNORMAL HIGH (ref 0.0–100.0)

## 2020-05-21 LAB — COMPREHENSIVE METABOLIC PANEL
ALT: 98 U/L — ABNORMAL HIGH (ref 0–44)
AST: 85 U/L — ABNORMAL HIGH (ref 15–41)
Albumin: 3 g/dL — ABNORMAL LOW (ref 3.5–5.0)
Alkaline Phosphatase: 115 U/L (ref 38–126)
Anion gap: 23 — ABNORMAL HIGH (ref 5–15)
BUN: 125 mg/dL — ABNORMAL HIGH (ref 8–23)
CO2: 16 mmol/L — ABNORMAL LOW (ref 22–32)
Calcium: 9.1 mg/dL (ref 8.9–10.3)
Chloride: 102 mmol/L (ref 98–111)
Creatinine, Ser: 3.89 mg/dL — ABNORMAL HIGH (ref 0.44–1.00)
GFR, Estimated: 10 mL/min — ABNORMAL LOW (ref >60)
Glucose, Bld: 58 mg/dL — ABNORMAL LOW (ref 70–99)
Potassium: 6.1 mmol/L — ABNORMAL HIGH (ref 3.5–5.1)
Sodium: 141 mmol/L (ref 135–145)
Total Bilirubin: 2.9 mg/dL — ABNORMAL HIGH (ref 0.3–1.2)
Total Protein: 5.6 g/dL — ABNORMAL LOW (ref 6.5–8.1)

## 2020-05-21 LAB — I-STAT CHEM 8, ED
BUN: >130 mg/dL — ABNORMAL HIGH (ref 8–23)
Calcium, Ion: 0.87 mmol/L — CL (ref 1.15–1.40)
Chloride: 112 mmol/L — ABNORMAL HIGH (ref 98–111)
Creatinine, Ser: 3.6 mg/dL — ABNORMAL HIGH (ref 0.44–1.00)
Glucose, Bld: 59 mg/dL — ABNORMAL LOW (ref 70–99)
HCT: 60 % — ABNORMAL HIGH (ref 36.0–46.0)
Hemoglobin: 20.4 g/dL — ABNORMAL HIGH (ref 12.0–15.0)
Potassium: 7.1 mmol/L (ref 3.5–5.1)
Sodium: 140 mmol/L (ref 135–145)
TCO2: 21 mmol/L — ABNORMAL LOW (ref 22–32)

## 2020-05-21 LAB — APTT: aPTT: 41 seconds — ABNORMAL HIGH (ref 24–36)

## 2020-05-21 LAB — PROTIME-INR
INR: 1.9 — ABNORMAL HIGH (ref 0.8–1.2)
Prothrombin Time: 21 seconds — ABNORMAL HIGH (ref 11.4–15.2)

## 2020-05-21 LAB — TROPONIN I (HIGH SENSITIVITY)
Troponin I (High Sensitivity): 223 ng/L (ref <18)
Troponin I (High Sensitivity): 239 ng/L (ref <18)

## 2020-05-21 LAB — CK: Total CK: 281 U/L — ABNORMAL HIGH (ref 38–234)

## 2020-05-21 LAB — POC OCCULT BLOOD, ED: Fecal Occult Bld: POSITIVE — AB

## 2020-05-21 LAB — LACTIC ACID, PLASMA
Lactic Acid, Venous: 4.4 mmol/L (ref 0.5–1.9)
Lactic Acid, Venous: 5 mmol/L (ref 0.5–1.9)

## 2020-05-21 MED ORDER — POLYVINYL ALCOHOL 1.4 % OP SOLN
1.0000 [drp] | Freq: Four times a day (QID) | OPHTHALMIC | Status: DC | PRN
  Filled 2020-05-21: qty 15

## 2020-05-21 MED ORDER — SODIUM CHLORIDE 0.9 % IV SOLN
1.0000 g | Freq: Once | INTRAVENOUS | Status: AC
  Administered 2020-05-21: 1 g via INTRAVENOUS
  Filled 2020-05-21: qty 10

## 2020-05-21 MED ORDER — ONDANSETRON HCL 4 MG/2ML IJ SOLN: 4.0000 mg | Freq: Four times a day (QID) | INTRAMUSCULAR | Status: DC | PRN

## 2020-05-21 MED ORDER — GLYCOPYRROLATE 1 MG PO TABS
1.0000 mg | ORAL_TABLET | ORAL | Status: DC | PRN
  Filled 2020-05-21: qty 1

## 2020-05-21 MED ORDER — HALOPERIDOL 0.5 MG PO TABS
0.5000 mg | ORAL_TABLET | ORAL | Status: DC | PRN
  Filled 2020-05-21: qty 1

## 2020-05-21 MED ORDER — GLYCOPYRROLATE 0.2 MG/ML IJ SOLN
0.2000 mg | INTRAMUSCULAR | Status: DC | PRN
Start: 2020-05-21 — End: 2020-05-22
  Filled 2020-05-21: qty 1

## 2020-05-21 MED ORDER — FENTANYL CITRATE (PF) 100 MCG/2ML IJ SOLN: 25.0000 ug | INTRAMUSCULAR | Status: DC | PRN

## 2020-05-21 MED ORDER — MORPHINE SULFATE (CONCENTRATE) 10 MG/0.5ML PO SOLN
10.0000 mg | ORAL | Status: DC | PRN
Start: 2020-05-21 — End: 2020-05-21
  Administered 2020-05-21: 10 mg via ORAL
  Filled 2020-05-21: qty 0.5

## 2020-05-21 MED ORDER — ACETAMINOPHEN 325 MG PO TABS: 650.0000 mg | ORAL_TABLET | Freq: Four times a day (QID) | ORAL | Status: DC | PRN

## 2020-05-21 MED ORDER — DEXTROSE 50 % IV SOLN
1.0000 | Freq: Once | INTRAVENOUS | Status: AC
  Administered 2020-05-21: 50 mL via INTRAVENOUS

## 2020-05-21 MED ORDER — ONDANSETRON 4 MG PO TBDP: 4.0000 mg | ORAL_TABLET | Freq: Four times a day (QID) | ORAL | Status: DC | PRN

## 2020-05-21 MED ORDER — ACETAMINOPHEN 650 MG RE SUPP: 650.0000 mg | Freq: Four times a day (QID) | RECTAL | Status: DC | PRN

## 2020-05-21 MED ORDER — SODIUM CHLORIDE 0.9 % IV BOLUS
1000.0000 mL | Freq: Once | INTRAVENOUS | Status: AC
  Administered 2020-05-21: 1000 mL via INTRAVENOUS

## 2020-05-21 MED ORDER — LORAZEPAM 2 MG/ML IJ SOLN: 1.0000 mg | INTRAMUSCULAR | Status: DC | PRN

## 2020-05-21 MED ORDER — GLYCOPYRROLATE 0.2 MG/ML IJ SOLN
0.2000 mg | INTRAMUSCULAR | Status: DC | PRN
  Filled 2020-05-21: qty 1

## 2020-05-21 MED ORDER — FENTANYL CITRATE (PF) 100 MCG/2ML IJ SOLN
25.0000 ug | INTRAMUSCULAR | Status: DC
  Administered 2020-05-21 – 2020-05-22 (×4): 25 ug via INTRAVENOUS
  Filled 2020-05-21 (×4): qty 2

## 2020-05-21 MED ORDER — HALOPERIDOL LACTATE 5 MG/ML IJ SOLN: 0.5000 mg | INTRAMUSCULAR | Status: DC | PRN

## 2020-05-21 MED ORDER — BIOTENE DRY MOUTH MT LIQD: 15.0000 mL | OROMUCOSAL | Status: DC | PRN

## 2020-05-21 MED ORDER — SODIUM CHLORIDE 0.9 % IV SOLN: INTRAVENOUS | Status: DC

## 2020-05-21 MED ORDER — SODIUM CHLORIDE 0.9 % IV BOLUS: 500.0000 mL | Freq: Once | INTRAVENOUS | Status: DC

## 2020-05-21 NOTE — ED Triage Notes (Signed)
Pt bib ems from [facility] with new onset afib rvr, weakness and black toes/fingertips. BP 588 systolic, HR 325.

## 2020-05-21 NOTE — ED Provider Notes (Signed)
[facility] Provider Note   CSN: [id number] Arrival date & time: 05/29/2020  1337     History Chief Complaint  Patient presents with  . Weakness    Sierra Allen is a 86 y.o. female.  Sierra Allen is a 86 y.o. female with a past medical history of breast cancer, osteoporosis, hyperlipidemia, GI bleeding due to diverticuli and internal hemorrhoids, macular degeneration, who presents vis EMS from her PCPs office for evaluation of weakness and hypotension.  Patient found to be in new onset A. fib with RVR with heart rates ranging 120-150s.  Blood pressure around 80/60 on arrival.  Patient is very hard of hearing, but is not able to provide much history she is somewhat confused and lethargic.  Per PCP notes patient has been increasingly weak and lethargic over the past 3 days, she was not able to stand up at the office, appears very dry and dehydrated.  She has a history of lower peripheral vascular disease, but PCP noted that her legs look significantly darker than before and were very cold to the touch, they were unable to palpate pulses.  I spoke with Dr. Drema Dallas at [facility] who states that she last saw the patient on December 7 and December 9 at that time she had some swelling and pain to the lower extremities and was treated with antibiotics for lower extremity cellulitis, and today she looks significantly more ill, swelling has resolved, but her legs have become significantly darker, due to swelling she did not have documented pulses from prior visit.  Caregiver does not know of any fevers, patient has not had vomiting or diarrhea and has not been complaining of focal pain, but has been much less active and not eating and drinking well over the past few days.  Caregiver states she has only been with her for 3 weeks and she has been declining.  I also called to obtain some history from the patient's son, Sierra Allen, who is the patient's  healthcare power of attorney, he lives in [place] and patient does not have any other local family members.  He states that the patient is a DNR and would not want CPR, intubation or extreme measures but would like care otherwise.        Past Medical History:  Diagnosis Date  . Cancer [facility])    [place] Cancer    Patient Active Problem List   Diagnosis Date Noted  . Hyperkalemia 05/27/2020    Past Surgical History:  Procedure Laterality Date  . BACK SURGERY    . Femur Rod    . LYMPHADENECTOMY     Right arm  . TONSILLECTOMY       OB History    Gravida      Para      Term      Preterm      AB      Living  2     SAB      IAB      Ectopic      Multiple      Live Births              No family history on file.  Social History   Tobacco Use  . Smoking status: Never Smoker  . Smokeless tobacco: Never Used  Vaping Use  . Vaping Use: Never used  Substance Use Topics  . Alcohol use: Not Currently  . Drug use: Not Currently    Home Medications  Prior to Admission medications   Medication Sig Start Date End Date Taking? Authorizing Provider  CALCIUM-MAGNESIUM-ZINC PO Take 1 tablet by mouth daily.    [provider]  DENTA 5000 PLUS 1.1 % CREA dental cream Take 1 application by mouth daily. 04/09/20   [provider]  vitamin C (ASCORBIC ACID) 500 MG tablet Take 500 mg by mouth every other day.     [provider]    Allergies    Penicillins and Shellfish allergy  Review of Systems   Review of Systems  Unable to perform ROS: Mental status change    Physical Exam Updated Vital Signs BP 98/86   Pulse (!) 152   Temp (!) 93.7 F (34.3 C) (Rectal)   Resp (!) 23   SpO2 99%   Physical Exam Vitals and nursing note reviewed.  Constitutional:      General: She is in acute distress.     Appearance: She is well-nourished. She is cachectic. She is ill-appearing. She is not diaphoretic.     Comments: Patient is lethargic,  ill-appearing and cachectic  HENT:     Head: Normocephalic and atraumatic.     Comments: No signs of trauma over the scalp    Nose: Nose normal.     Mouth/Throat:     Mouth: Oropharynx is clear and moist. Mucous membranes are dry.     Comments: Mucous membranes are extremely dry with some brown staining over the teeth and tongue Eyes:     General:        Right eye: No discharge.        Left eye: No discharge.     Extraocular Movements: EOM normal.  Cardiovascular:     Rate and Rhythm: Tachycardia present. Rhythm irregular.     Pulses: Intact distal pulses.     Comments: Tachycardia with irregularly irregular rhythm Distal radial pulses faint and hands are cool with some cyanosis of the fingertips Unable to palpate pedal pulses Pulmonary:     Effort: Pulmonary effort is normal. No respiratory distress.     Breath sounds: Normal breath sounds. No wheezing or rales.  Abdominal:     General: Bowel sounds are normal. There is no distension.     Palpations: Abdomen is soft. There is no mass.     Tenderness: There is no abdominal tenderness. There is no guarding.  Musculoskeletal:        General: No edema.     Cervical back: Neck supple.     Comments: Bilateral feet and ankles with dark purplish black discoloration they are cold to the touch with no palpable or dopplerable DP or PT pulses  Skin:    General: Skin is cool and dry.  Neurological:     Mental Status: She is lethargic.     Coordination: Coordination normal.     Comments: Speech is quiet and somewhat confused, able to follow some commands Able to move extremities independently  Psychiatric:     Comments: Unable to assess due to mental status changes and acuity of condition     ED Results / Procedures / Treatments   Labs (all labs ordered are listed, but only abnormal results are displayed) Labs Reviewed  COMPREHENSIVE METABOLIC PANEL - Abnormal; Notable for the following components:      Result Value   Potassium 6.1  (*)    CO2 16 (*)    Glucose, Bld 58 (*)    BUN 125 (*)    Creatinine, Ser 3.89 (*)  Total Protein 5.6 (*)    Albumin 3.0 (*)    AST 85 (*)    ALT 98 (*)    Total Bilirubin 2.9 (*)    GFR, Estimated 10 (*)    Anion gap 23 (*)    All other components within normal limits  LACTIC ACID, PLASMA - Abnormal; Notable for the following components:   Lactic Acid, Venous 4.4 (*)    All other components within normal limits  BRAIN NATRIURETIC PEPTIDE - Abnormal; Notable for the following components:   B Natriuretic Peptide 564.7 (*)    All other components within normal limits  CBC WITH DIFFERENTIAL/PLATELET - Abnormal; Notable for the following components:   WBC 17.1 (*)    RBC 6.04 (*)    Hemoglobin 19.1 (*)    HCT 59.3 (*)    RDW 18.7 (*)    Platelets 34 (*)    nRBC 0.9 (*)    Neutro Abs 15.8 (*)    Lymphs Abs 0.3 (*)    All other components within normal limits  PROTIME-INR - Abnormal; Notable for the following components:   Prothrombin Time 21.0 (*)    INR 1.9 (*)    All other components within normal limits  APTT - Abnormal; Notable for the following components:   aPTT 41 (*)    All other components within normal limits  CK - Abnormal; Notable for the following components:   Total CK 281 (*)    All other components within normal limits  I-STAT CHEM 8, ED - Abnormal; Notable for the following components:   Potassium 7.1 (*)    Chloride 112 (*)    BUN >130 (*)    Creatinine, Ser 3.60 (*)    Glucose, Bld 59 (*)    Calcium, Ion 0.87 (*)    TCO2 21 (*)    Hemoglobin 20.4 (*)    HCT 60.0 (*)    All other components within normal limits  CBG MONITORING, ED - Abnormal; Notable for the following components:   Glucose-Capillary 61 (*)    All other components within normal limits  POC OCCULT BLOOD, ED - Abnormal; Notable for the following components:   Fecal Occult Bld POSITIVE (*)    All other components within normal limits  CBG MONITORING, ED - Abnormal; Notable for the  following components:   Glucose-Capillary 182 (*)    All other components within normal limits  I-STAT VENOUS BLOOD GAS, ED - Abnormal; Notable for the following components:   pCO2, Ven 29.2 (*)    pO2, Ven 61.0 (*)    Bicarbonate 18.0 (*)    TCO2 19 (*)    Acid-base deficit 5.0 (*)    Potassium 5.9 (*)    Calcium, Ion 0.96 (*)    HCT 49.0 (*)    Hemoglobin 16.7 (*)    All other components within normal limits  TROPONIN I (HIGH SENSITIVITY) - Abnormal; Notable for the following components:   Troponin I (High Sensitivity) 223 (*)    All other components within normal limits  CULTURE, BLOOD (ROUTINE X 2)  CULTURE, BLOOD (ROUTINE X 2)  RESP PANEL BY RT-PCR (FLU A&B, COVID) ARPGX2  LACTIC ACID, PLASMA  BLOOD GAS, VENOUS  TROPONIN I (HIGH SENSITIVITY)    EKG EKG Interpretation  Date/Time:  Thursday May 21 2020 13:51:04 EST Ventricular Rate:  150 PR Interval:    QRS Duration: 78 QT Interval:  300 QTC Calculation: 474 R Axis:   -55 Text Interpretation: Atrial fibrillation with  rapid ventricular response Left axis deviation Low voltage QRS Inferior infarct , age undetermined Cannot rule out Anteroseptal infarct , age undetermined Abnormal ECG Confirmed by Sherwood Gambler [phone]) on 05/15/2020 2:13:44 PM   Radiology DG Chest Port 1 View  Result Date: 05/10/2020 CLINICAL DATA:  Weakness. New onset atrial fibrillation. EXAM: PORTABLE CHEST 1 VIEW COMPARISON:  Remote chest radiograph 12/27/2007. FINDINGS: Asymmetric density of the hemithoraces with right increased compared to left. Favor large pleural effusion. There may be minimal leftward mediastinal shift. Heart size is difficult to assess given opacity in the right hemithorax, but appears mildly enlarged. Vascular congestion in the left lung. No pneumothorax. Bones are diffusely under mineralized. Surgical clips in the right axilla. IMPRESSION: 1. Asymmetric density of the hemithoraces with right diffusely increased compared to  left, favor large pleural effusion. Consider further evaluation with chest CT. 2. Vascular congestion. Electronically Signed   By: Keith Rake M.D.   On: 06/05/2020 15:44    Procedures .Critical Care Performed by: Jacqlyn Larsen, PA-C Authorized by: Jacqlyn Larsen, PA-C   Critical care provider statement:    Critical care time (minutes):  45   Critical care was necessary to treat or prevent imminent or life-threatening deterioration of the following conditions:  Metabolic crisis, dehydration and renal failure   Critical care was time spent personally by me on the following activities:  Discussions with consultants, evaluation of patient's response to treatment, examination of patient, ordering and performing treatments and interventions, ordering and review of laboratory studies, ordering and review of radiographic studies, pulse oximetry, re-evaluation of patient's condition, obtaining history from patient or surrogate and review of old charts   Care discussed with: admitting provider     (including critical care time)  Medications Ordered in ED Medications  calcium chloride 1 g in sodium chloride 0.9 % 100 mL IVPB (has no administration in time range)  morphine CONCENTRATE 10 MG/0.5ML oral solution 10 mg (has no administration in time range)  LORazepam (ATIVAN) injection 1 mg (has no administration in time range)  sodium chloride 0.9 % bolus 1,000 mL (0 mLs Intravenous Stopped 05/07/2020 1537)  dextrose 50 % solution 50 mL (50 mLs Intravenous Given 05/28/2020 1424)    ED Course  I have reviewed the triage vital signs and the nursing notes.  Pertinent labs & imaging results that were available during my care of the patient were reviewed by me and considered in my medical decision making (see chart for details).    MDM Rules/Calculators/A&P                         86 year old female arrives via EMS critically ill-appearing in new onset A. fib with RVR, hypotensive at 80/59 with  heart rates ranging in the 120s-150s.  Bilateral lower extremities are blackish purple and very cold to the touch without palpable or dopplerable pulses she has some cyanosis of the fingertips as well but does have a faint palpable radial pulse bilaterally.  She is somewhat alert and talking but is confused.  She was brought in after increasing weakness and lethargy at home over the past 3 days, and was taken to her PCPs office today.  Found to be hypotensive and tachycardic in the office.  History of lower extremity peripheral vascular disease but PCP reports this looks significantly worse and darker than usual.  Patient is very ill, I spoke with patient's son who is located in [place] but is the healthcare power of  attorney for his mother, and he expresses that she would not want CPR or intubation, but would like labs, diagnostics, fluids and antibiotics.  Will initiate broad work-up, patient appears very dehydrated with dry mucous membranes will start IV fluids and evaluate for potential infectious source, metabolic derangements, cardiac etiology, will also check blood gas, chest x-ray and EKG.  We will hold off on attempting rate control in the setting of hypotension.  If patient's lab work allows, she may need a CT angiogram with runoff to assess for arterial occlusion of the lower extremities.  This could also be in the setting of hypoperfusion. Patient hypothermic  I-STAT Chem-8 with significant derangements, patient with acute renal failure with creatinine of 3.6, was 1.02 on lab work done last week, BUN greater than 130, potassium of 7.1, bicarb of 21, hemoglobin of 20 noted which suggests significant hemoconcentration.  She also has a glucose of 59, an amp of D50 was given  With this severe acute renal failure, feel patient would likely require dialysis and other extreme measures to manage this, I called and spoke with patient's son who states that his mom would not want dialysis or anything that  would require her to be hooked up to machine or have invasive tubes or lines placed, after discussing her numerous medical issues going on today he would like to focus on keeping his mom comfortable, will continue IV fluids, bear hugger placed to help warm the patient given hypothermia will consult medicine and palliative care for comfort care admission.  Case discussed with Dr. Roosevelt Locks with [facility] hospitalist who will admit patient to continue with comfort care measures.  I also spoke with palliative care who will see the patient in consult and speak with the patient's son to assist with comfort care.  Final Clinical Impression(s) / ED Diagnoses Final diagnoses:  Acute renal failure, unspecified acute renal failure type ([place])  Hyperkalemia  Hypoglycemia  Lower limb ischemia  Heme positive stool    Rx / DC Orders ED Discharge Orders    None       Jacqlyn Larsen, [place] 05/20/2020 1602    Sherwood Gambler, MD 06-12-2020 [phone]

## 2020-05-21 NOTE — ED Notes (Signed)
Bair Hugger applied

## 2020-05-21 NOTE — H&P (Addendum)
History and Physical    Sierra Allen UUV:[id number] DOB: December 26, 1926 DOA: 05/17/2020  PCP: [facility] [place] [facility] (Confirm with patient/family/NH records and if not entered, this has to be entered at [facility] point of entry) Patient coming from: Home  I have personally briefly reviewed patient's old medical records in [place]  Chief Complaint: "Tell me what is going on"  HPI: Sierra Allen is a 86 y.o. female with medical history significant of remote history of breast cancer, vitamin D deficiency, was sent from PCPs office for evaluation of new onset A. fib.  Patient is quite confused, and ill, unable to provide much of the history, and I tried to reach patient caregiver Sander Radon [phone]) and left a message.  It appeared that the patient has been in her usual status of health until couple of days ago, with decreased oral intake and patient also complained about new onset of "stomach pain".  And patient sent to her PCP/ER physician today, but none it was found patient was in rapid A. fib and blood pressure to low to measure, thus sent to ED. ED Course: Tachycardia in heart rate in 150s, hypotensive and hypothermia.  Bear hugger applied.  Lab came back showing AKI with severe hyperkalemia.  Repeat BMP showed again AKI and hyperkalemia, EKG showed tented T waves.  Patient was given calcium gluconate and cocktail of insulin D50.  ED PA told patient son/POA over phone who residing in [place] [phone])  Review of Systems: Unable to perform patient confused  Past Medical History:  Diagnosis Date  . Cancer [facility])    Brest Cancer    Past Surgical History:  Procedure Laterality Date  . BACK SURGERY    . Femur Rod    . LYMPHADENECTOMY     Right arm  . TONSILLECTOMY       reports that she has never smoked. She has never used smokeless tobacco. She reports previous alcohol use. She reports previous drug use.  Allergies  Allergen Reactions  .  Penicillins Swelling    Did it involve swelling of the face/tongue/throat, SOB, or low BP? Unknown Did it involve sudden or severe rash/hives, skin peeling, or any reaction on the inside of your mouth or nose? Unknown Did you need to seek medical attention at a hospital or doctor's office? Unknown When did it last happen?Childhood rxn If all above answers are "NO", may proceed with cephalosporin use.  . Shellfish Allergy Hives and Swelling    Lips and tongue swelling    No family history on file.   Prior to Admission medications   Medication Sig Start Date End Date Taking? Authorizing Provider  CALCIUM-MAGNESIUM-ZINC PO Take 1 tablet by mouth daily.    [provider]  DENTA 5000 PLUS 1.1 % CREA dental cream Take 1 application by mouth daily. 04/09/20   [provider]  vitamin C (ASCORBIC ACID) 500 MG tablet Take 500 mg by mouth every other day.     [provider]    Physical Exam: Vitals:   06/05/2020 1408 05/24/2020 1428 05/16/2020 1430 05/07/2020 1500  BP: (!) 80/59  90/76 98/86  Pulse: (!) 151  (!) 128 (!) 152  Resp: (!) 22  19 (!) 23  Temp:  (!) 93.7 F (34.3 C)    TempSrc:  Rectal    SpO2: 100%  99% 99%    Constitutional: NAD, calm, comfortable Vitals:   05/25/2020 1408 06/03/2020 1428 05/15/2020 1430 05/20/2020 1500  BP: (!) 80/59  90/76 98/86  Pulse: (!) 151  (!) 128 (!) 152  Resp: (!) 22  19 (!) 23  Temp:  (!) 93.7 F (34.3 C)    TempSrc:  Rectal    SpO2: 100%  99% 99%   Eyes: PERRL, lids and conjunctivae normal ENMT: Mucous membranes are dry, dried blood in her mouth. Posterior pharynx clear of any exudate or lesions.Normal dentition.  Neck: normal, supple, no masses, no thyromegaly Respiratory: clear to auscultation bilaterally, no wheezing, no crackles. Normal respiratory effort. No accessory muscle use.  Cardiovascular: Regular rate and rhythm, no murmurs / rubs / gallops. No extremity edema. 2+ pedal pulses. No carotid bruits.  Abdomen: no  tenderness, no masses palpated. No hepatosplenomegaly. Bowel sounds positive.  Musculoskeletal: Cyanosis on bilateral lower extremities below the knee. No joint deformity upper and lower extremities. Good ROM, no contractures. Normal muscle tone.  Skin: no rashes, lesions, ulcers. No induration Neurologic: Confused with episodes of agitation, moving both arms but not legs.  Not following commands Psychiatric: Confused with episodes of agitation       (Anything < 9 systems with 2 bullets each down codes to level 1) (If patient refuses exam can't bill higher level) (Make sure to document decubitus ulcers present on admission -- if possible -- and whether patient has chronic indwelling catheter at time of admission)  Labs on Admission: I have personally reviewed following labs and imaging studies  CBC: Recent Labs  Lab 05/30/2020 1359 05/25/2020 1421 05/10/2020 1511  WBC 17.1*  --   --   NEUTROABS 15.8*  --   --   HGB 19.1* 20.4* 16.7*  HCT 59.3* 60.0* 49.0*  MCV 98.2  --   --   PLT 34*  --   --    Basic Metabolic Panel: Recent Labs  Lab 05/27/2020 1359 05/10/2020 1421 05/28/2020 1511  NA 141 140 141  K 6.1* 7.1* 5.9*  CL 102 112*  --   CO2 16*  --   --   GLUCOSE 58* 59*  --   BUN 125* >130*  --   CREATININE 3.89* 3.60*  --   CALCIUM 9.1  --   --    GFR: CrCl cannot be calculated (Unknown ideal weight.). Liver Function Tests: Recent Labs  Lab 05/24/2020 1359  AST 85*  ALT 98*  ALKPHOS 115  BILITOT 2.9*  PROT 5.6*  ALBUMIN 3.0*   No results for input(s): LIPASE, AMYLASE in the last 168 hours. No results for input(s): AMMONIA in the last 168 hours. Coagulation Profile: Recent Labs  Lab 05/28/2020 1359  INR 1.9*   Cardiac Enzymes: Recent Labs  Lab 05/26/2020 1359  CKTOTAL 281*   BNP (last 3 results) No results for input(s): PROBNP in the last 8760 hours. HbA1C: No results for input(s): HGBA1C in the last 72 hours. CBG: Recent Labs  Lab 05/16/2020 1419  05/20/2020 1503  GLUCAP 61* 182*   Lipid Profile: No results for input(s): CHOL, HDL, LDLCALC, TRIG, CHOLHDL, LDLDIRECT in the last 72 hours. Thyroid Function Tests: No results for input(s): TSH, T4TOTAL, FREET4, T3FREE, THYROIDAB in the last 72 hours. Anemia Panel: No results for input(s): VITAMINB12, FOLATE, FERRITIN, TIBC, IRON, RETICCTPCT in the last 72 hours. Urine analysis:    Component Value Date/Time   COLORURINE YELLOW 12/27/2007 1448   APPEARANCEUR CLOUDY (A) 12/27/2007 1448   LABSPEC 1.013 12/27/2007 1448   PHURINE 7.5 12/27/2007 1448   GLUCOSEU NEGATIVE 12/27/2007 1448   HGBUR NEGATIVE 12/27/2007 1448   BILIRUBINUR NEGATIVE  12/27/2007 1448   KETONESUR 15 (A) 12/27/2007 1448   PROTEINUR NEGATIVE 12/27/2007 1448   UROBILINOGEN 0.2 12/27/2007 1448   NITRITE NEGATIVE 12/27/2007 1448   LEUKOCYTESUR  12/27/2007 1448    NEGATIVE MICROSCOPIC NOT DONE ON URINES WITH NEGATIVE PROTEIN, BLOOD, LEUKOCYTES, NITRITE, OR GLUCOSE <1000 mg/dL.    Radiological Exams on Admission: DG Chest Port 1 View  Result Date: 06/05/2020 CLINICAL DATA:  Weakness. New onset atrial fibrillation. EXAM: PORTABLE CHEST 1 VIEW COMPARISON:  Remote chest radiograph 12/27/2007. FINDINGS: Asymmetric density of the hemithoraces with right increased compared to left. Favor large pleural effusion. There may be minimal leftward mediastinal shift. Heart size is difficult to assess given opacity in the right hemithorax, but appears mildly enlarged. Vascular congestion in the left lung. No pneumothorax. Bones are diffusely under mineralized. Surgical clips in the right axilla. IMPRESSION: 1. Asymmetric density of the hemithoraces with right diffusely increased compared to left, favor large pleural effusion. Consider further evaluation with chest CT. 2. Vascular congestion. Electronically Signed   By: Keith Rake M.D.   On: 06/04/2020 15:44    EKG: Independently reviewed.  Rapid A. fib with tented T waves, beats of  degenerated V. tach  Assessment/Plan Active Problems:   Hyperkalemia  (please populate well all problems here in Problem List. (For example, if patient is on BP meds at home and you resume or decide to hold them, it is a problem that needs to be her. Same for CAD, COPD, HLD and so on)  AKI with hyperkalemia and severe metabolic acidosis and uremia -Likely ATN, with EKG changes and despite slightly improved her potassium level after cocktail and IV fluids 7.1>5.9, but given pt's overall condition her kidney injury likely will progress and become permanent.  I am concerned about without dialysis, life threatening cardiac event is not avoidable and likely imminent. Discussed with patient's son Owen/POA over the phone, patient's son does not wish patient to get HD/CRRT.  On the other hand, patient's son expressed that they wish the patient be comfort and agreed with hospice. Hospice was paged in ED. -Admit to med MedSurg and start inpatient hospice care with as needed oral morphine and IV Ativan.  Probably DIC -She has elevated INR, thrombocytopenia.  Poor prognosis, as mentioned above, will pursue hospice care, discontinue other work-up and treatment plan.  Rapid A. Fib -With frequent degeneration of V. tach and ongoing hyperkalemia indicating poor prognosis. - Not more rate control measures as pt will go on hospice  Severe dehydration -Hemoconcentrated and hypotensive -Patient complaining about her mouth too dry, will start IVF for 24 hours as a comfort measure, until pt can pick up some PO intake.  Acute uremic metabolic encephalopathy -As mentioned above, since HD not an option and family agreed with comfort measure, patient on hospice.  Deconditioning -Seems that patient overall condition has deteriorated very fast over the last few days, the immediate problem is that without HD/CRRT, mortality is high but even with aggressive measures, I doubt we are able to prolong her life or even  improve her life quality, and other critical conditions such as coagulopathy/DIC vs TTP is of high mortality as well even if worked up and intervened on time. D/W ED PA, pt's son/POA, all agreed upon hospice plan. -Inpatient hospice.   DVT prophylaxis: None Code Status: DNR Family Communication: Son over the phone Disposition Plan: Inpatient hospice Consults called: Hospice Admission status: MedSurg> inpatient hospice   Lequita Halt MD [facility] Pager [phone]  05/17/2020,  3:52 PM

## 2020-05-21 NOTE — Consult Note (Signed)
Consultation Note Date: 05/27/2020   Patient Name: Sierra Allen  DOB: 02/18/1927  MRN: [id number]  Age / Sex: 86 y.o., female  PCP: [facility] [place] Referring Physician: Lequita Halt, MD  Reason for Consultation: Establishing goals of care, symptom management, comfort care  HPI/Patient Profile: 86 y.o. female  with remote history of breast cancer presented to the emergency department from her PCP office on 05/16/2020 for evaluation of new onset a-fib. Patient was in her usual state of health until a few days ago began having decreased oral intake and complaining of stomach pain. Patient was sent to her PCP office and found to be in rapid a-fib with blood pressure too low to measure.  ED Course: A-fib with rate 150's, hypotensive, and hypothermic. Labs showing AKI (creatinine 3.89), potassium 7.1, and lactic acid 4.4.  Patient was given calcium gluconate, insulin, and D50. ED provider notified son/HCPOA by phone, and he decided to pursue comfort care. Palliative Medicine was consulted to assist with transition to comfort care and symptom management.   Clinical Assessment and Goals of Care: I have reviewed medical records including EPIC notes, labs and imaging, and examined the patient. She appears uncomfortable and is stating "I just want to die".    I spoke with son/HCPOA Roxy Manns) by phone to discuss diagnosis, prognosis, [place], EOL wishes, disposition, and options.  I introduced Palliative Medicine as specialized medical care for people living with serious illness. It focuses on providing relief from the symptoms and stress of a serious illness.   We discussed a brief life review of the patient. She is originally from [place]. She relocated to [place] with her husband in the early 1980's. Her husband passed away about 25 years ago. They had 2 sons. Roxy Manns lives in [place] and is her  HCPOA.   Patient has lived at [facility] (independent living) for about 2 years. At baseline, she was ambulatory. She has a full-time caregiver and required assistance with ADLs. Her caregiver reports decreased appetite over the past few weeks.   I attempted to elicit values and goals of care important to the patient. It appeared she valued her independence. 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    We discussed her current medical situation. The difference between aggressive medical intervention and comfort care was considered in light of the patient's goals of care. Roxy Manns clearly understands her current medical condition and poor prognosis and has already decided to pursue a comfort path.   Discussed transitioning to comfort care while in the hospital, and what that involves--keeping her clean and dry, no labs, no artificial hydration or feeding, no antibiotics, minimizing of medications, comfort feeds (if appropriate), medication for pain and dyspnea as needed.   I introduced hospice philosophy and provided information on home vs residential hospice services - answered all questions.   I also spoke with caregiver Janie Morning) by phone. She shares that the patient previously expressed to her that  she wanted to return home. Hinton Dyer feels home with hospice would be in the patient's best interest by allowing her to remain in her familiar environment. Discussed that home hospice is intermittent care (not continuous). Hinton Dyer shares that she has been a caregiver for other patients in home hospice and she feels comfortable with providing end of life care.   Questions and concerns were addressed. Son and caregiver were provided with the PMT contact number and encouraged to call with questions or concerns.   Primary decision maker: Geral Tuch (son/HCPOA) [phone]    SUMMARY OF RECOMMENDATIONS    Full comfort measures  initiated  DNR/DNI as previously documented  Added orders for symptom management at EOL as well as discontinued orders that were not focused on comfort  Unrestricted visitation orders were placed per current [facility] EOL visitation policy   Provide frequent assessments and administer PRN medications as clinically necessary to ensure EOL comfort  Possible transition to hospice (return home versus transfer to hospice facility)  PMT will follow up with Roxy Manns tomorrow   Code Status/Advance Care Planning:  DNR  Symptom Management:   Scheduled pain medication: Fentanyl 25 mcg IV every 4 hours   Fentanyl 25 mcg every 2 hours as needed for pain or dyspnea  Lorazepam (ATIVAN) prn for anxiety  Haloperidol (HALDOL) prn for agitation   Glycopyrrolate (ROBINUL) for excessive secretions  Ondansetron (ZOFRAN) prn for nausea  Polyvinyl alcohol (LIQUIFILM TEARS) prn for dry eyes  Antiseptic oral rinse (BIOTENE) prn for dry mouth   Palliative Prophylaxis:   Frequent Pain Assessment, Oral Care and Turn Reposition  Additional Recommendations (Limitations, Scope, Preferences):  Full Comfort Care  Psycho-social/Spiritual:   Emotional support provided  Spiritual care consult for end of life   Prognosis:   days  Discharge Planning: To Be Determined      Primary Diagnoses: Present on Admission: . Hyperkalemia   I have reviewed the medical record, interviewed the patient and family, and examined the patient. The following aspects are pertinent.  Past Medical History:  Diagnosis Date  . Cancer [facility])    Brest Cancer    No family history on file. Scheduled Meds: . fentaNYL (SUBLIMAZE) injection  25 mcg Intravenous Q4H   Continuous Infusions: . sodium chloride 100 mL/hr at 06/04/2020 1740   PRN Meds:.acetaminophen **OR** acetaminophen, antiseptic oral rinse, fentaNYL (SUBLIMAZE) injection, glycopyrrolate **OR** glycopyrrolate **OR** glycopyrrolate, haloperidol **OR**  haloperidol lactate, LORazepam, ondansetron **OR** ondansetron (ZOFRAN) IV, polyvinyl alcohol Medications Prior to Admission:  Prior to Admission medications   Medication Sig Start Date End Date Taking? Authorizing Provider  CALCIUM-MAGNESIUM-ZINC PO Take 1 tablet by mouth daily.    [provider]  DENTA 5000 PLUS 1.1 % CREA dental cream Take 1 application by mouth daily. 04/09/20   [provider]  vitamin C (ASCORBIC ACID) 500 MG tablet Take 500 mg by mouth every other day.     [provider]   Allergies  Allergen Reactions  . Penicillins Swelling    Did it involve swelling of the face/tongue/throat, SOB, or low BP? Unknown Did it involve sudden or severe rash/hives, skin peeling, or any reaction on the inside of your mouth or nose? Unknown Did you need to seek medical attention at a hospital or doctor's office? Unknown When did it last happen?Childhood rxn If all above answers are "NO", may proceed with cephalosporin use.  . Shellfish Allergy Hives and Swelling    Lips and tongue swelling   Review of Systems  Unable to  perform ROS: Other    Physical Exam Constitutional:      Appearance: She is ill-appearing.     Comments: Frail appearing  Cardiovascular:     Rate and Rhythm: Tachycardia present.  Pulmonary:     Effort: Pulmonary effort is normal.  Neurological:     Mental Status: She is alert. She is confused.  Psychiatric:        Behavior: Behavior is agitated.     Vital Signs: BP 98/86   Pulse (!) 152   Temp (!) 93.7 F (34.3 C) (Rectal)   Resp (!) 23   SpO2 99%        SpO2: SpO2: 99 % O2 Device:SpO2: 99 % O2 Flow Rate: .         Palliative Assessment/Data: PPS 20%     Time In: 17:00 Time Out: 18:12 Time Total: 72 minutes Greater than 50%  of this time was spent counseling and coordinating care related to the above assessment and plan.  Signed by: Lavena Bullion, NP   Please contact Palliative Medicine Team phone  at [phone] for questions and concerns.  For individual provider: See Shea Evans

## 2020-05-22 DIAGNOSIS — R195 Other fecal abnormalities: Secondary | ICD-10-CM

## 2020-05-22 DIAGNOSIS — E162 Hypoglycemia, unspecified: Secondary | ICD-10-CM

## 2020-05-22 DIAGNOSIS — Z789 Other specified health status: Secondary | ICD-10-CM

## 2020-05-26 LAB — CULTURE, BLOOD (ROUTINE X 2)
Culture: NO GROWTH
Culture: NO GROWTH
Special Requests: ADEQUATE
Special Requests: ADEQUATE

## 2020-06-06 NOTE — Progress Notes (Signed)
Nutrition Brief Note  Chart reviewed. Pt now transitioning to comfort care.  No further nutrition interventions warranted at this time.  Please re-consult as needed.   Jaiden Dinkins W, RD, LDN, CDCES Registered Dietitian II Certified Diabetes Care and Education Specialist Please refer to [facility] for RD and/or RD on-call/weekend/after hours pager  

## 2020-06-06 NOTE — ED Notes (Signed)
Called 5N with no answer

## 2020-06-06 NOTE — Progress Notes (Signed)
Pt found by RN to be pulseless and not breathing. Second RN verified. MD made aware. Son, Roxy Manns, called and RN left message. Both sons came to pt room and saw pt. Pt belongings given to sons.

## 2020-06-06 NOTE — Death Summary Note (Signed)
Death Summary  Sierra Allen HLK:[id number] DOB: [birth year] DOA: June 20, 2020  PCP: [facility] [place]  Admit date: 06/20/20 Date of Death: 06-21-2020 Time of Death: 12:44 pm Notification: [facility] [place] notified of death of 2020-06-21   History of present illness:  Sierra Allen is a 87 y.o. female with a history of remote history of breast cancer and new onset atrial fibrillation.  Sierra Allen presented with complaint of new onset atrial fibrillation in the setting of hypotension and hypothermia.   Patient not feeling well for several days, with decreased po intake and new onset of abdominal pain. Unable to give detailed history due to altered mentation on admission. Patient was seen by her primary care provider, and she was referred to the ED due to her hemodynamic instability.  On her initial physical examination in the ED, blood pressure 80/59, HR 151 and RR 22, with temperature of 34.3 C, 02 98% on RA. She had dry mucous membranes, lungs with no wheezing, heart S1 S2 present and tachycardic, abdomen soft and non tender, lower extremities with severe cyanosis bilaterally, patient was altered with episodic agitation.  Na 141, K 6,1, CL 102, bicarbonate 16, glucose 58, BUN 125 and cr 3,8. Lactic acid 4,4. COVID 19 negative Wbc 17,1, hgb 19,1  hct 59, plt 34.  Chest film with large right pleural effusion.  EKG 150 bpm, left axis deviation, normal intervals, small complex, atrial fibrillation with poor R wave progression, no significant ST segment or T wave changes.   Patient critically ill, with rapid deterioration. Her family was contacted and she was transitioned to comfort care.   Sierra Allen did not improve after  Final Diagnoses:  1.   Septic shock due to possible pneumonia right side with large pleural effusion.  2.   AKI with ATN complicated with severe acidosis and hyperkalemia 3.   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  4.   Atrial fibrillation with rapid ventricular response.    The results of significant diagnostics from this hospitalization (including imaging, microbiology, ancillary and laboratory) are listed below for reference.    Significant Diagnostic Studies: DG Chest Port 1 View  Result Date: 06/20/2020 CLINICAL DATA:  Weakness. New onset atrial fibrillation. EXAM: PORTABLE CHEST 1 VIEW COMPARISON:  Remote chest radiograph 12/27/2007. FINDINGS: Asymmetric density of the hemithoraces with right increased compared to left. Favor large pleural effusion. There may be minimal leftward mediastinal shift. Heart size is difficult to assess given opacity in the right hemithorax, but appears mildly enlarged. Vascular congestion in the left lung. No pneumothorax. Bones are diffusely under mineralized. Surgical clips in the right axilla. IMPRESSION: 1. Asymmetric density of the hemithoraces with right diffusely increased compared to left, favor large pleural effusion. Consider further evaluation with chest CT. 2. Vascular congestion. Electronically Signed   By: Keith Rake M.D.   On: 2020/06/20 15:44    Microbiology: Recent Results (from the past 240 hour(s))  Culture, blood (routine x 2)     Status: None (Preliminary result)   Collection Time: Jun 20, 2020  2:43 PM   Specimen: BLOOD  Result Value Ref Range Status   Specimen Description BLOOD SITE NOT SPECIFIED  Final   Special Requests   Final    BOTTLES DRAWN AEROBIC AND ANAEROBIC Blood Culture adequate volume   Culture   Final    NO GROWTH < 24 HOURS Performed at [facility], [address] [place], [place] [address]    Report Status  PENDING  Incomplete  Culture, blood (routine x 2)     Status: None (Preliminary result)   Collection Time: 05/28/2020  2:58 PM   Specimen: BLOOD  Result Value Ref Range Status   Specimen Description BLOOD SITE NOT SPECIFIED  Final   Special Requests    Final    BOTTLES DRAWN AEROBIC ONLY Blood Culture adequate volume   Culture   Final    NO GROWTH < 24 HOURS Performed at [facility], [address] [place], [place] [address]    Report Status PENDING  Incomplete  Resp Panel by RT-PCR (Flu A&B, Covid) Nasopharyngeal Swab     Status: None   Collection Time: 05/09/2020  3:37 PM   Specimen: Nasopharyngeal Swab; Nasopharyngeal(NP) swabs in vial transport medium  Result Value Ref Range Status   SARS Coronavirus 2 by RT PCR NEGATIVE NEGATIVE Final    Comment: (NOTE) SARS-CoV-2 target nucleic acids are NOT DETECTED.  The SARS-CoV-2 RNA is generally detectable in upper respiratory specimens during the acute phase of infection. The lowest concentration of SARS-CoV-2 viral copies this assay can detect is 138 copies/mL. A negative result does not preclude SARS-Cov-2 infection and should not be used as the sole basis for treatment or other patient management decisions. A negative result may occur with  improper specimen collection/handling, submission of specimen other than nasopharyngeal swab, presence of viral mutation(s) within the areas targeted by this assay, and inadequate number of viral copies(<138 copies/mL). A negative result must be combined with clinical observations, patient history, and epidemiological information. The expected result is Negative.  Fact Sheet for Patients:  EntrepreneurPulse.com.au  Fact Sheet for Healthcare Providers:  IncredibleEmployment.be  This test is no t yet approved or cleared by the [place] FDA and  has been authorized for detection and/or diagnosis of SARS-CoV-2 by FDA under an Emergency Use Authorization (EUA). This EUA will remain  in effect (meaning this test can be used) for the duration of the COVID-19 declaration under Section 564(b)(1) of the Act, 21 U.S.C.section 360bbb-3(b)(1), unless the authorization is terminated  or revoked sooner.        Influenza A by PCR NEGATIVE NEGATIVE Final   Influenza B by PCR NEGATIVE NEGATIVE Final    Comment: (NOTE) The Xpert Xpress SARS-CoV-2/FLU/RSV plus assay is intended as an aid in the diagnosis of influenza from Nasopharyngeal swab specimens and should not be used as a sole basis for treatment. Nasal washings and aspirates are unacceptable for Xpert Xpress SARS-CoV-2/FLU/RSV testing.  Fact Sheet for Patients: EntrepreneurPulse.com.au  Fact Sheet for Healthcare Providers: IncredibleEmployment.be  This test is not yet approved or cleared by the [place] FDA and has been authorized for detection and/or diagnosis of SARS-CoV-2 by FDA under an Emergency Use Authorization (EUA). This EUA will remain in effect (meaning this test can be used) for the duration of the COVID-19 declaration under Section 564(b)(1) of the Act, 21 U.S.C. section 360bbb-3(b)(1), unless the authorization is terminated or revoked.  Performed at [facility], [place] [address], [place], [place] [address]      Labs: Basic Metabolic Panel: Recent Labs  Lab 05/08/2020 1359 05/13/2020 1421 05/31/2020 1511  NA 141 140 141  K 6.1* 7.1* 5.9*  CL 102 112*  --   CO2 16*  --   --   GLUCOSE 58* 59*  --   BUN 125* >130*  --   CREATININE 3.89* 3.60*  --   CALCIUM 9.1  --   --  Liver Function Tests: Recent Labs  Lab 06/03/2020 1359  AST 85*  ALT 98*  ALKPHOS 115  BILITOT 2.9*  PROT 5.6*  ALBUMIN 3.0*   No results for input(s): LIPASE, AMYLASE in the last 168 hours. No results for input(s): AMMONIA in the last 168 hours. CBC: Recent Labs  Lab 05/16/2020 1359 05/18/2020 1421 05/10/2020 1511  WBC 17.1*  --   --   NEUTROABS 15.8*  --   --   HGB 19.1* 20.4* 16.7*  HCT 59.3* 60.0* 49.0*  MCV 98.2  --   --   PLT 34*  --   --    Cardiac Enzymes: Recent Labs  Lab 05/29/2020 1359  CKTOTAL 281*   D-Dimer No results for input(s): DDIMER in the last 72  hours. BNP: Invalid input(s): POCBNP CBG: Recent Labs  Lab 05/13/2020 1419 05/09/2020 1503  GLUCAP 61* 182*   Anemia work up No results for input(s): VITAMINB12, FOLATE, FERRITIN, TIBC, IRON, RETICCTPCT in the last 72 hours. Urinalysis    Component Value Date/Time   COLORURINE YELLOW 12/27/2007 1448   APPEARANCEUR CLOUDY (A) 12/27/2007 1448   LABSPEC 1.013 12/27/2007 1448   PHURINE 7.5 12/27/2007 1448   GLUCOSEU NEGATIVE 12/27/2007 1448   HGBUR NEGATIVE 12/27/2007 1448   BILIRUBINUR NEGATIVE 12/27/2007 1448   KETONESUR 15 (A) 12/27/2007 1448   PROTEINUR NEGATIVE 12/27/2007 1448   UROBILINOGEN 0.2 12/27/2007 1448   NITRITE NEGATIVE 12/27/2007 1448   LEUKOCYTESUR  12/27/2007 1448    NEGATIVE MICROSCOPIC NOT DONE ON URINES WITH NEGATIVE PROTEIN, BLOOD, LEUKOCYTES, NITRITE, OR GLUCOSE <1000 mg/dL.   Sepsis Labs Invalid input(s): PROCALCITONIN,  WBC,  LACTICIDVEN     SIGNED:  Tawni Millers, MD  [facility] 06/08/2020, 12:51 PM Pager   If 7PM-7AM, please contact night-coverage [email or web address] Password [email or web address]

## 2020-06-06 NOTE — Progress Notes (Signed)
Daily Progress Note   Patient Name: Sierra Allen       Date: 05-Jun-2020 DOB: January 28, 1927  Age: 87 y.o. MRN#: [id number] Attending Physician: Tawni Millers Primary Care Physician: [facility] [place] Admit Date: 05/11/2020  Reason for Consultation/Follow-up: Disposition, Non pain symptom management, Pain control, Psychosocial/spiritual support and Terminal Care  Subjective: 11:10 AM Chart review performed. Received report from primary RN - no acute concerns. RN states patient is not eating/drinking, not having meaningful interaction, and not showing signs of distress. RN states patient's son's flight was getting in around 11:00am - asked her to notify me when he arrives to bedside.  Went to visit patient at bedside - no family/visitors present. Patient was lying in bed - she did not respond to voice or gentle touch. No signs or non-verbal gestures of pain or discomfort noted. No respiratory distress, increased work of breathing, or secretions noted.   2:30 PM Spoke with primary RN who states patient has peacefully passed away and there are no further needs at this time.   Length of Stay: 1  Current Medications: Scheduled Meds:  . fentaNYL (SUBLIMAZE) injection  25 mcg Intravenous Q4H    Continuous Infusions: . sodium chloride 100 mL/hr at 06/05/2020 0219    PRN Meds: acetaminophen **OR** acetaminophen, antiseptic oral rinse, fentaNYL (SUBLIMAZE) injection, glycopyrrolate **OR** glycopyrrolate **OR** glycopyrrolate, haloperidol **OR** haloperidol lactate, LORazepam, ondansetron **OR** ondansetron (ZOFRAN) IV, polyvinyl alcohol  Physical Exam Constitutional:      General: She is not in acute distress.    Appearance: She is cachectic. She is  ill-appearing.  Pulmonary:     Effort: No respiratory distress.  Skin:    General: Skin is cool and dry.  Neurological:     Mental Status: She is lethargic.     Motor: Weakness present.  Psychiatric:        Speech: She is noncommunicative.             Vital Signs: BP (!) 54/27 (BP Location: Right Arm)   Pulse (!) 109   Temp (!) 97.3 F (36.3 C) (Axillary)   Resp 12   SpO2 98%  SpO2: SpO2: 98 % O2 Device: O2 Device: Room Air O2 Flow Rate:    Intake/output summary: No intake or output data in the 24 hours ending 2020-06-05 1105  LBM: Last BM Date: 06/11/20 Baseline Weight:   Most recent weight:         Palliative Assessment/Data: PPS 10%      Patient Active Problem List   Diagnosis Date Noted  . Hyperkalemia 06/05/2020  . Acute renal failure ([place])   . Lower limb ischemia     Palliative Care Assessment & Plan   Patient Profile: 87 y.o. female  with remote history of breast cancer presented to the emergency department from her PCP office on 05/30/2020 for evaluation of new onset a-fib. Patient was in her usual state of health until a few days ago began having decreased oral intake and complaining of stomach pain. Patient was sent to her PCP office and found to be in rapid a-fib with blood pressure too low to measure.  ED Course: A-fib with rate 150's, hypotensive, and hypothermic. Labs showing AKI (creatinine 3.89), potassium 7.1, and lactic acid 4.4.  Patient was given calcium gluconate, insulin, and D50. ED provider notified son/HCPOA by phone, and he decided to pursue comfort care. Palliative Medicine was consulted to assist with transition to comfort care and symptom management.  Assessment: AKI Rapid atrial fibrillation Severe dehydration Acute uremic metabolic encephalopathy Deconditioning Lower limb ischemia Heme positive stool Hyperkalemia Hypoglycemia Hypotension Terminal care  Recommendations/Plan:  Continue full comfort measures  Continue DNR/DNI  as previously documented  Continue EOL medication regimen, patient appears comfortable  Provide frequent assessments and administer PRN medications as clinically necessary to ensure EOL comfort  PMT will continue to follow holistically  2:30 PM Update:  Patient has peacefully passed away, no further PMT needs  Goals of Care and Additional Recommendations:  Limitations on Scope of Treatment: Full Comfort Care  Code Status:    Code Status Orders  (From admission, onward)         Start     Ordered   05/20/2020 1810  Do not attempt resuscitation (DNR)  Continuous       Question Answer Comment  In the event of cardiac or respiratory ARREST Do not call a "code blue"   In the event of cardiac or respiratory ARREST Do not perform Intubation, CPR, defibrillation or ACLS   In the event of cardiac or respiratory ARREST Use medication by any route, position, wound care, and other measures to relive pain and suffering. May use oxygen, suction and manual treatment of airway obstruction as needed for comfort.      05/24/2020 1815        Code Status History    Date Active Date Inactive Code Status Order ID Comments User Context   06/02/2020 1546 05/18/2020 1815 DNR [id number]  Lequita Halt, MD ED   Advance Care Planning Activity       Prognosis:   Hours - Days  Discharge Planning:  Anticipated Hospital Death  Care plan was discussed with primary RN  Thank you for allowing the Palliative Medicine Team to assist in the care of this patient.   Total Time 15 minutes Prolonged Time Billed  no       Greater than 50%  of this time was spent counseling and coordinating care related to the above assessment and plan.  Lin Landsman, NP  Please contact Palliative Medicine Team phone at [phone] for questions and concerns.

## 2020-06-06 DEATH — deceased

## 2020-07-12 IMAGING — DX DG KNEE COMPLETE 4+V*L*
4 series · 4 of 4 positions shown · non-contrast
Comparison: None.

CLINICAL DATA: Fall today; diffuse right lower leg pain; patient
just stated her lower leg was aching

EXAM:
LEFT KNEE - COMPLETE 4+ VIEW

[knee ap]
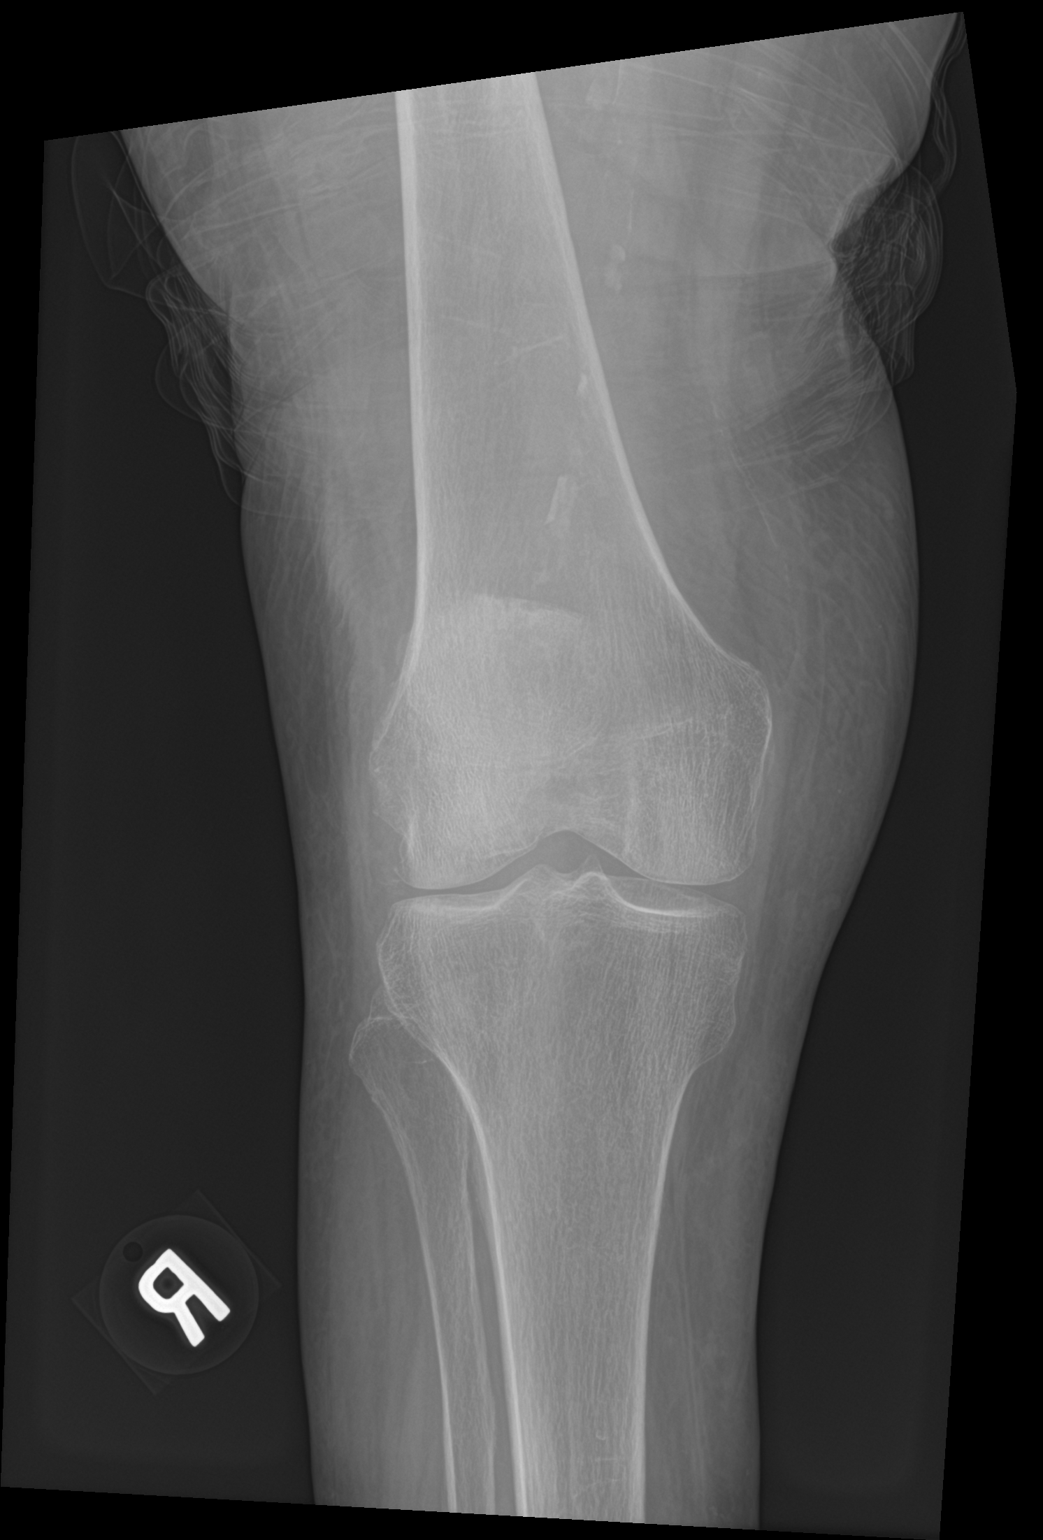

[knee lat]
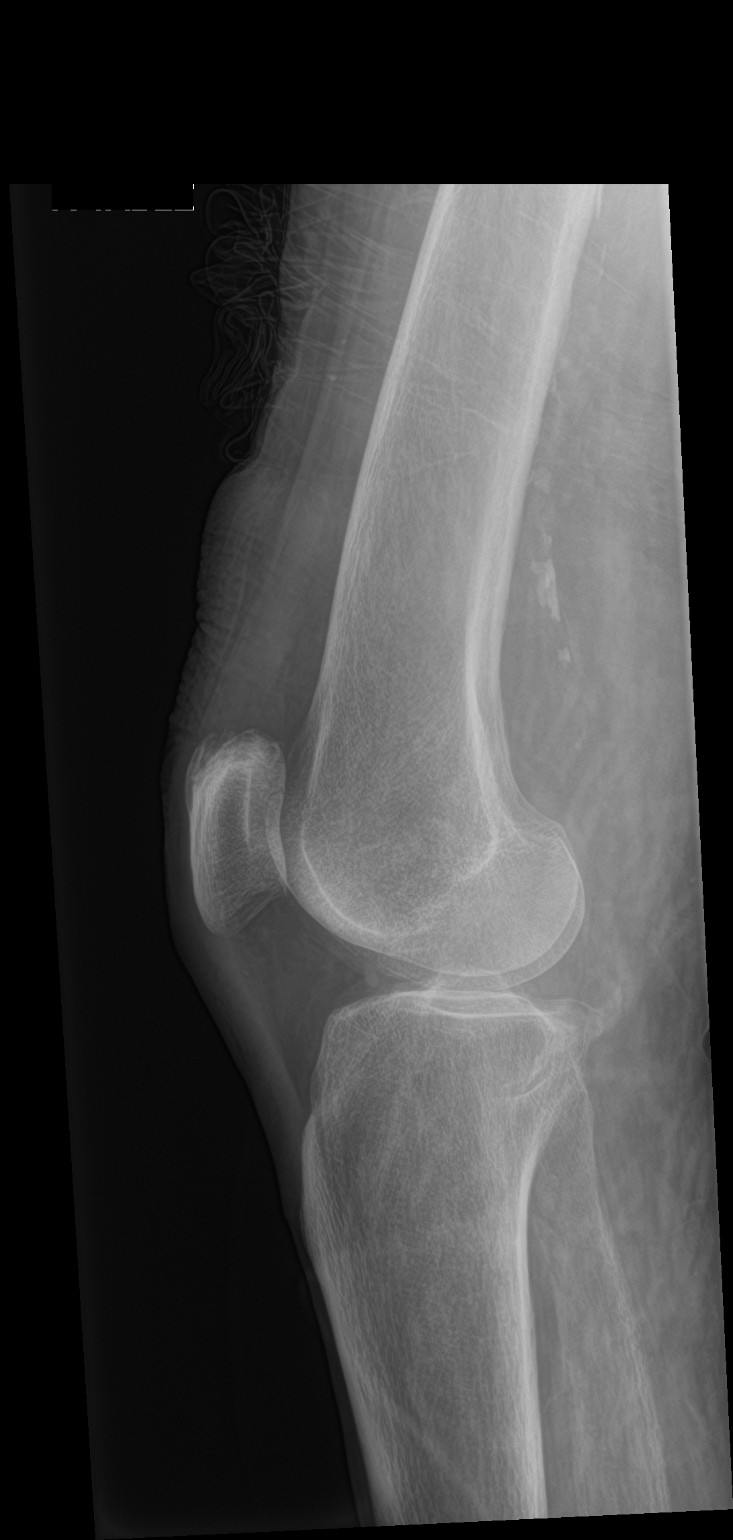

[knee obl (1 of 2)]
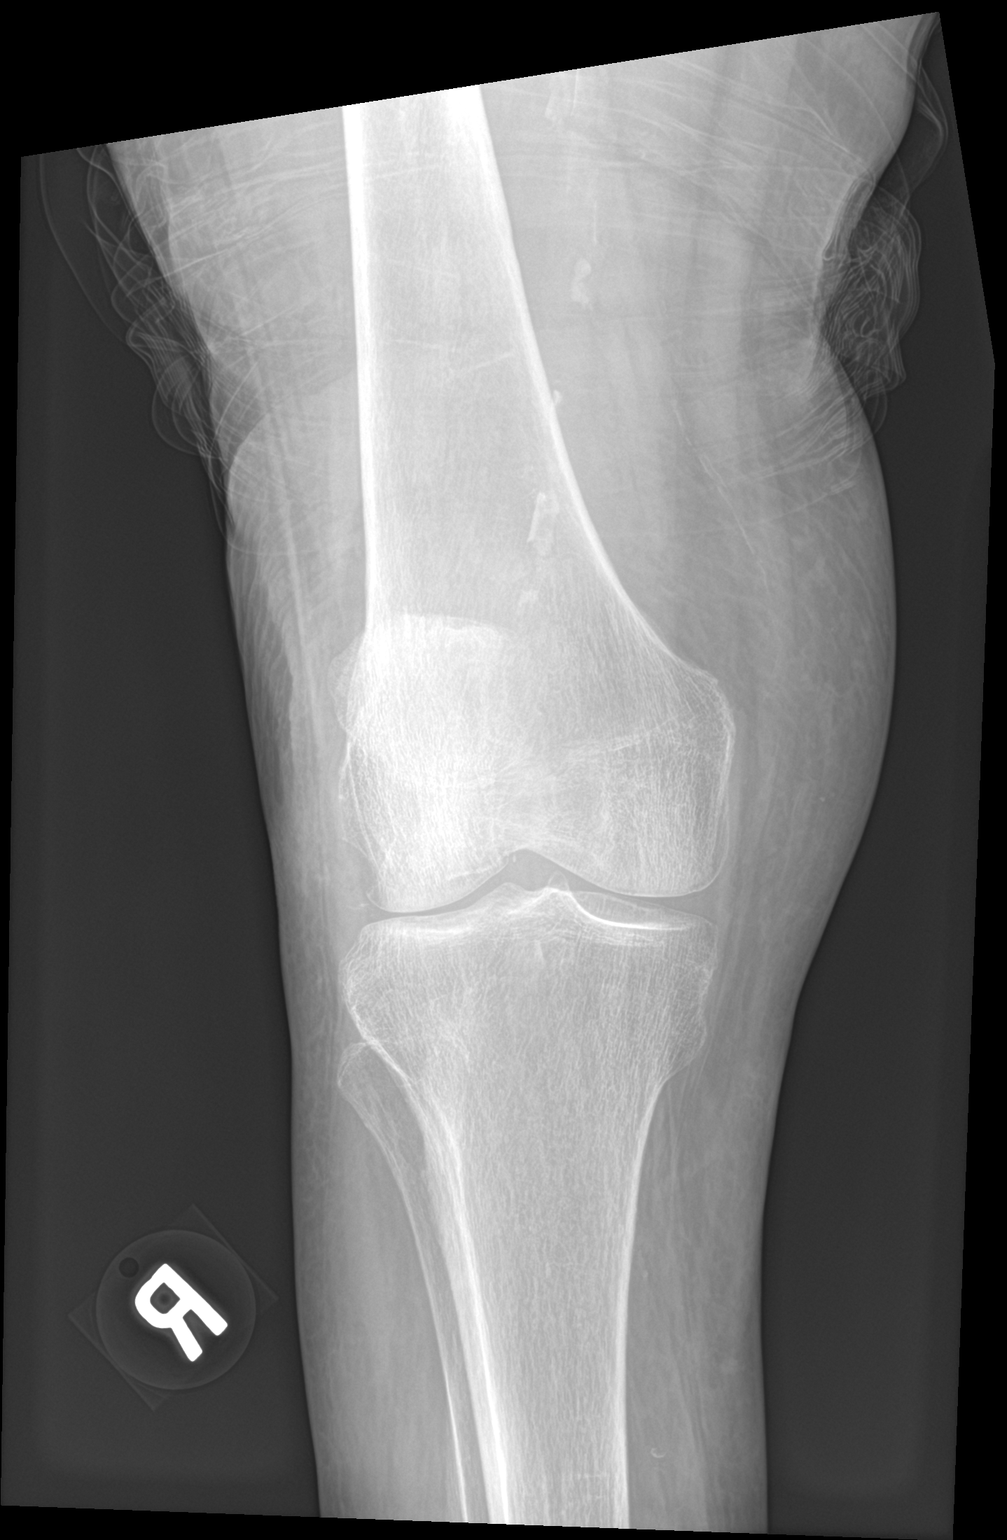

[knee obl (2 of 2)]
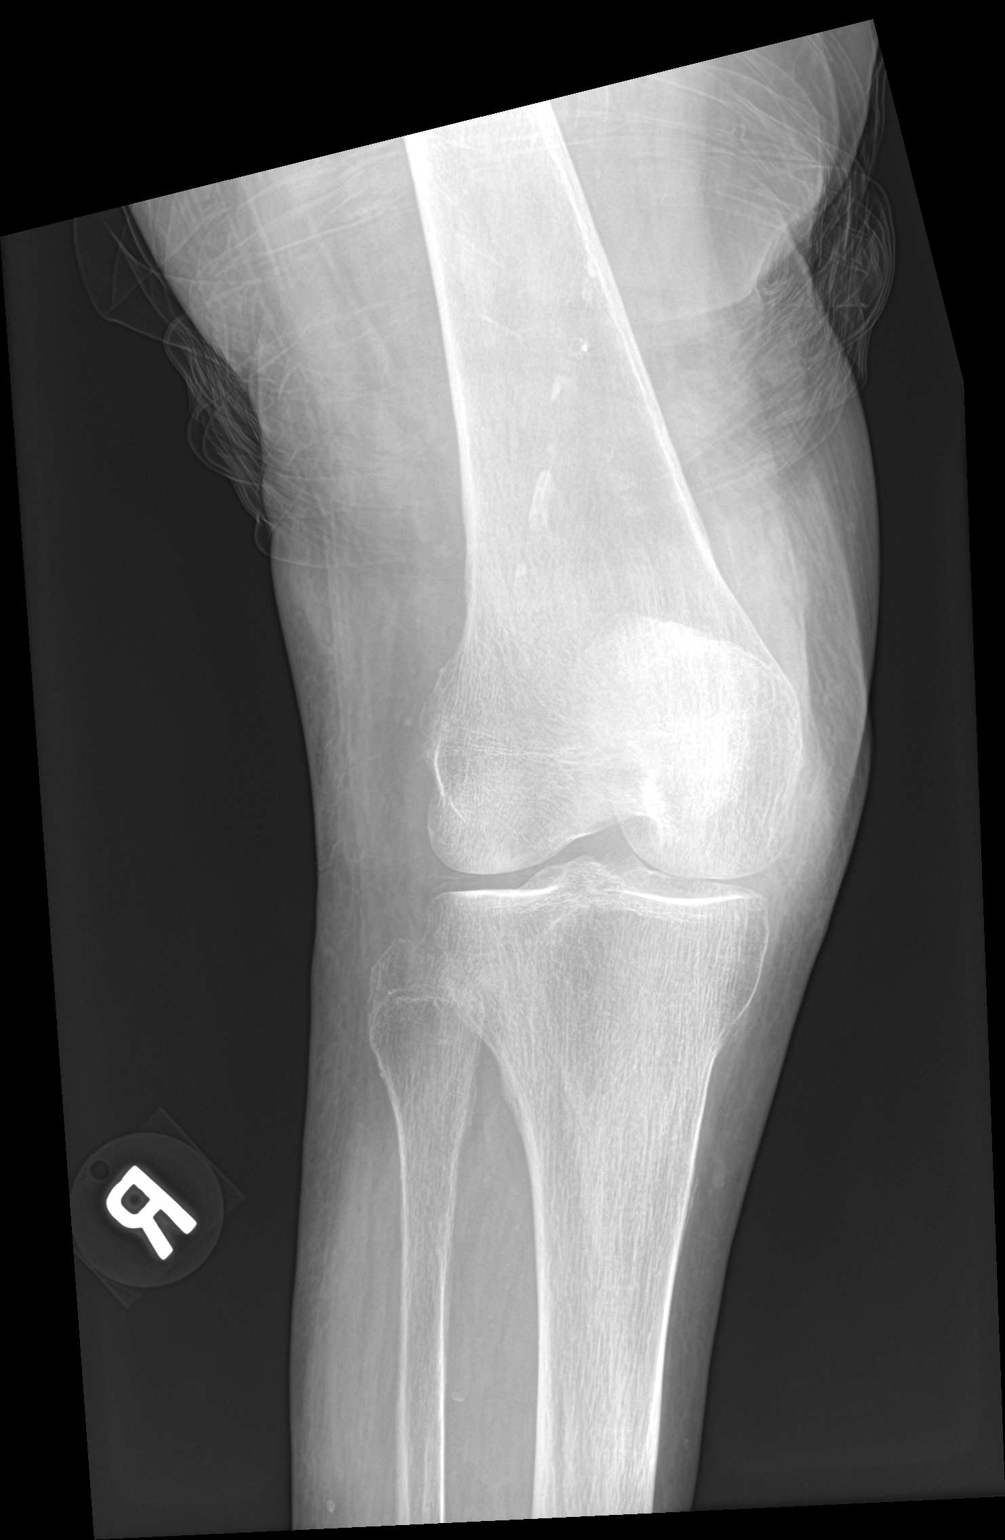

[4 of 4 positions shown; findings below may reference images not displayed]

FINDINGS: No fracture.  No bone lesion.

Mild narrowing of the lateral joint space compartment. No other
arthropathic/degenerative change.

No joint effusion.

There scattered posterior arterial vascular calcifications. Soft
tissues are otherwise unremarkable.
IMPRESSION: No fracture or dislocation.

## 2020-07-12 IMAGING — DX DG ANKLE COMPLETE 3+V*R*
3 series · 3 of 3 positions shown · non-contrast
Comparison: None.

CLINICAL DATA: Fall today.  Diffuse right leg pain.

EXAM:
RIGHT ANKLE - COMPLETE 3+ VIEW

[ankle ap]
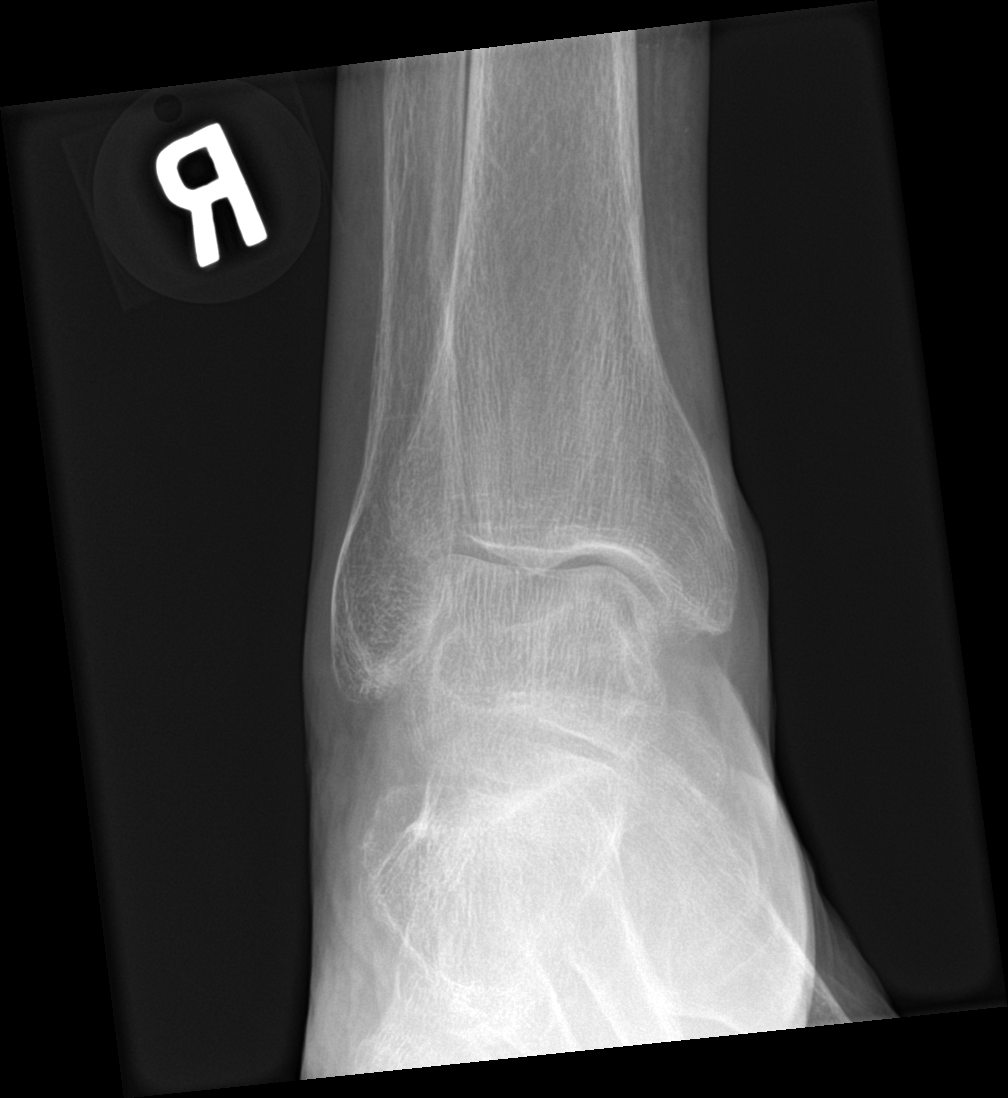

[ankle obl]
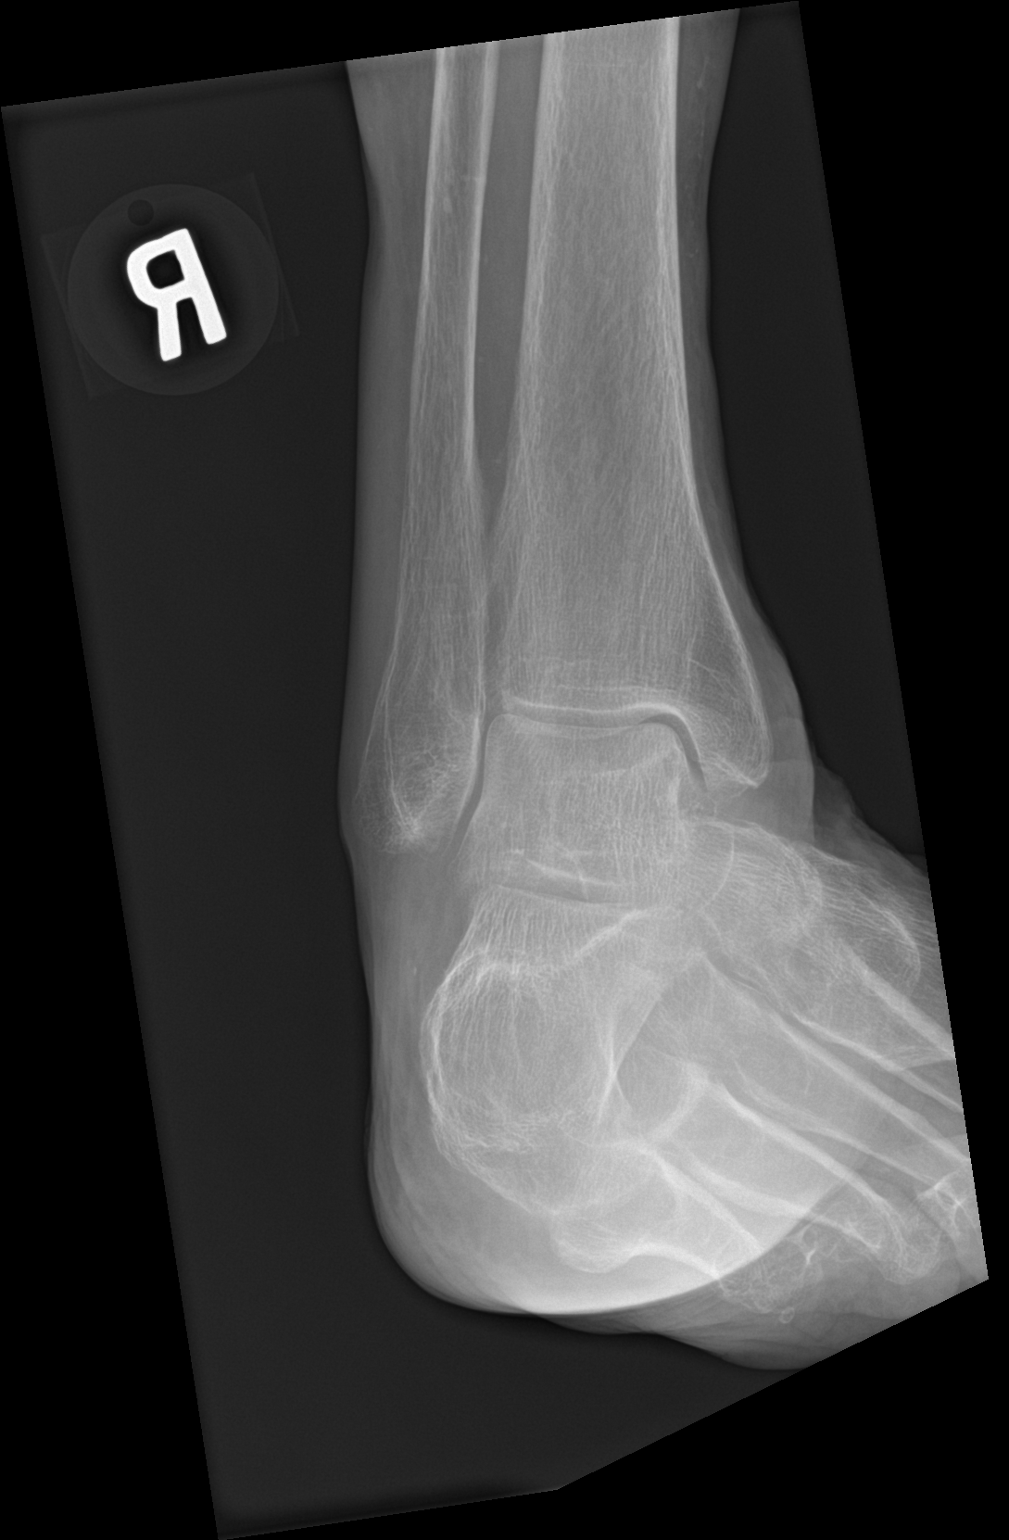

[ankle lat]
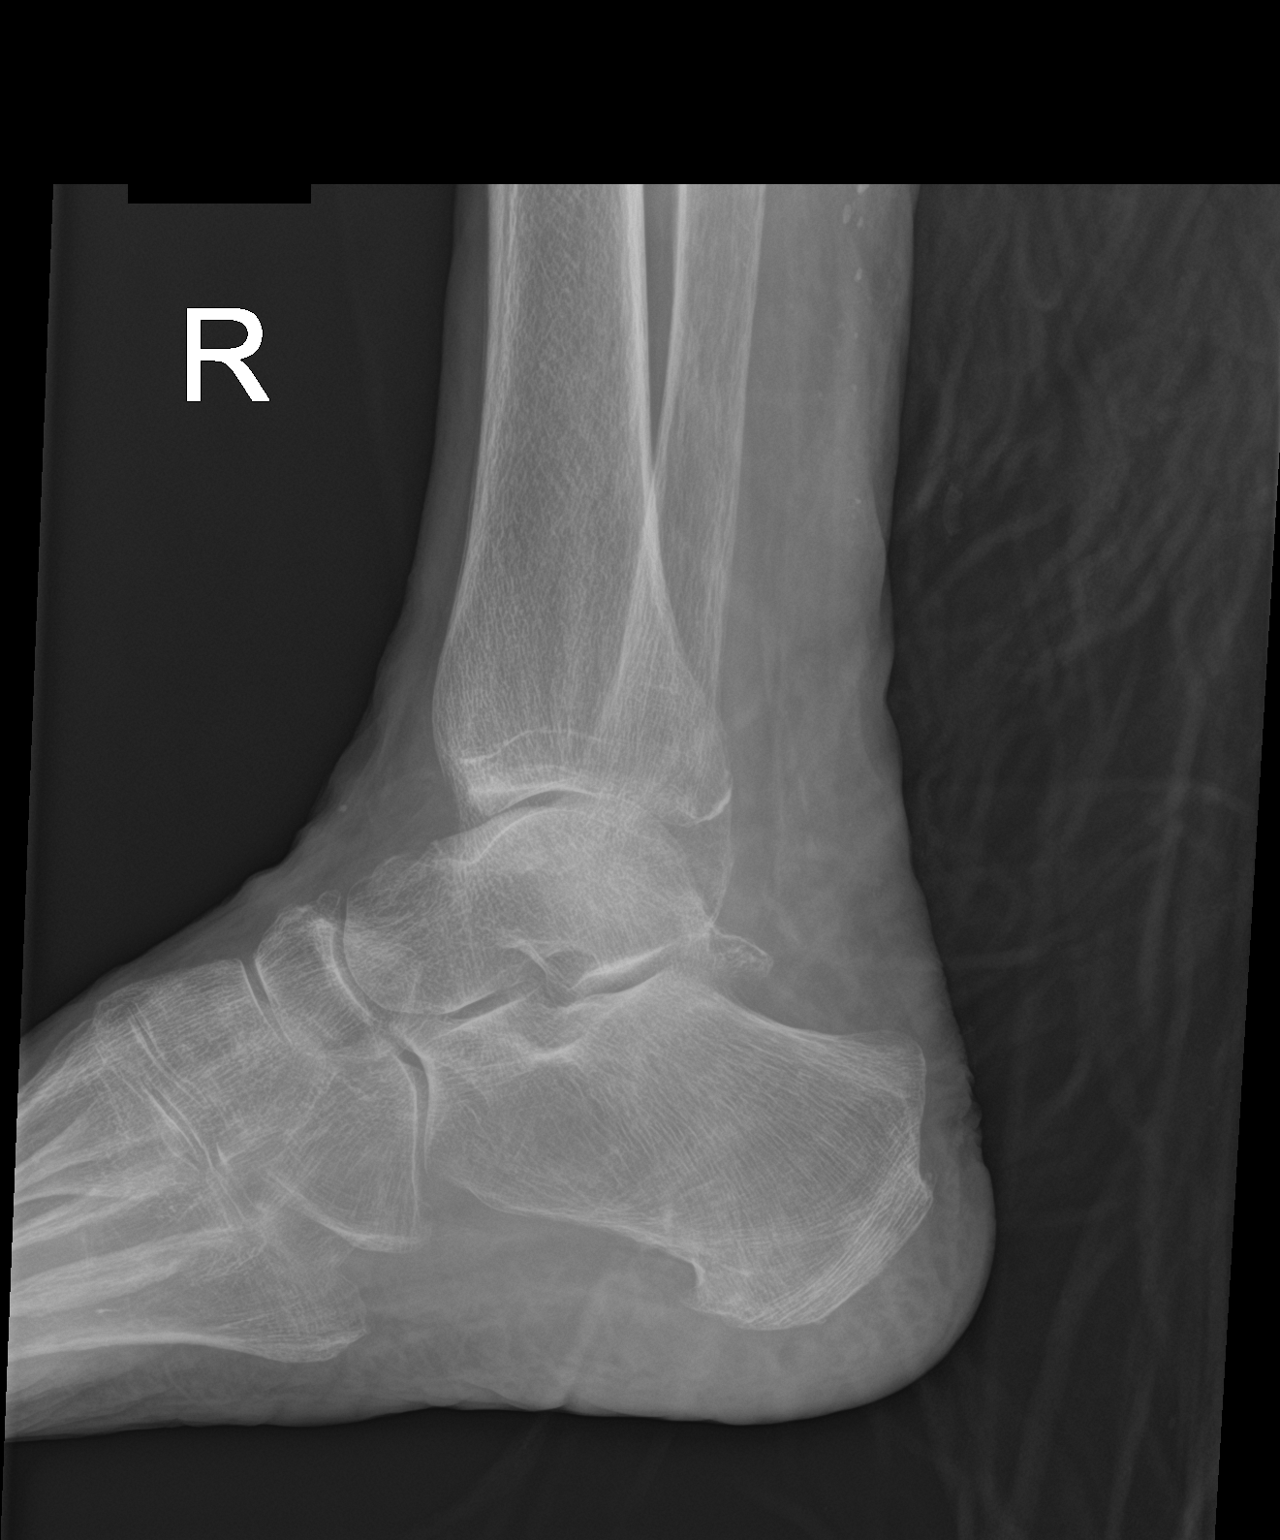

[3 of 3 positions shown; findings below may reference images not displayed]

FINDINGS: No fracture.  No bone lesion.  Bones are diffusely demineralized.

Ankle joint is normally spaced and aligned.

Soft tissues are unremarkable.
IMPRESSION: No fracture or dislocation.
